# Patient Record
Sex: Female | Born: 1945 | Race: White | Hispanic: No | Marital: Married | Smoking: Never smoker
Health system: Southern US, Community
[De-identification: ages and names within clinical notes are randomized; demographics above are authoritative.]

## PROBLEM LIST (undated history)

## (undated) DIAGNOSIS — I10 Essential (primary) hypertension: Secondary | ICD-10-CM

## (undated) DIAGNOSIS — E1169 Type 2 diabetes mellitus with other specified complication: Secondary | ICD-10-CM

## (undated) DIAGNOSIS — F32A Depression, unspecified: Secondary | ICD-10-CM

## (undated) DIAGNOSIS — I493 Ventricular premature depolarization: Secondary | ICD-10-CM

## (undated) DIAGNOSIS — M810 Age-related osteoporosis without current pathological fracture: Secondary | ICD-10-CM

## (undated) DIAGNOSIS — E119 Type 2 diabetes mellitus without complications: Secondary | ICD-10-CM

## (undated) DIAGNOSIS — Z87442 Personal history of urinary calculi: Secondary | ICD-10-CM

## (undated) DIAGNOSIS — M1990 Unspecified osteoarthritis, unspecified site: Secondary | ICD-10-CM

## (undated) HISTORY — DX: Type 2 diabetes mellitus without complications: E11.9

## (undated) HISTORY — DX: Ventricular premature depolarization: I49.3

## (undated) HISTORY — PX: CATARACT EXTRACTION: SUR2

## (undated) HISTORY — DX: Essential (primary) hypertension: I10

## (undated) HISTORY — PX: COLONOSCOPY WITH ESOPHAGOGASTRODUODENOSCOPY (EGD): SHX5779

## (undated) HISTORY — DX: Type 2 diabetes mellitus with other specified complication: E11.69

---

## 2020-11-09 ENCOUNTER — Other Ambulatory Visit: Payer: Self-pay

## 2020-11-09 ENCOUNTER — Ambulatory Visit: Payer: Medicare PPO | Admitting: Cardiology

## 2020-11-09 ENCOUNTER — Encounter: Payer: Self-pay | Admitting: Cardiology

## 2020-11-09 VITALS — BP 124/60 | HR 70 | Ht 64.5 in | Wt 145.0 lb

## 2020-11-09 DIAGNOSIS — I10 Essential (primary) hypertension: Secondary | ICD-10-CM

## 2020-11-09 DIAGNOSIS — E1169 Type 2 diabetes mellitus with other specified complication: Secondary | ICD-10-CM

## 2020-11-09 DIAGNOSIS — Z716 Tobacco abuse counseling: Secondary | ICD-10-CM

## 2020-11-09 DIAGNOSIS — E119 Type 2 diabetes mellitus without complications: Secondary | ICD-10-CM | POA: Diagnosis not present

## 2020-11-09 DIAGNOSIS — E785 Hyperlipidemia, unspecified: Secondary | ICD-10-CM

## 2020-11-09 DIAGNOSIS — Z7189 Other specified counseling: Secondary | ICD-10-CM

## 2020-11-09 DIAGNOSIS — I493 Ventricular premature depolarization: Secondary | ICD-10-CM

## 2020-11-09 DIAGNOSIS — Z794 Long term (current) use of insulin: Secondary | ICD-10-CM

## 2020-11-09 NOTE — Patient Instructions (Signed)

## 2020-11-09 NOTE — Progress Notes (Signed)
Cardiology Office Note:    Date:  11/09/2020   ID:  Sarah Key, DOB 1945/09/10, MRN [id number]  PCP:  Patient, No Pcp Per  Cardiologist:  Buford Dresser, MD  Referring MD: Sidonie Dickens, MD   CC: new patient evaluation for PVCs  History of Present Illness:    Sarah Key is a 75 y.o. female with a hx of type II diabetes, hypertension, hyperlipidemia, PVCs who is seen as a new consult at the request of Kuritzky, Cherylann Banas, MD for the evaluation and management of PVCs.  I reviewed notes from [facility]. This included hard copies of echo from 06/23/2019 and monitor 05/2019. These are summarized below.  Today: Moved to [place], moved into [facility]. Was followed by Dr. Madlyn Frankel in [facility] prior to her move. Reviewed records today.   Has been followed for PVCs for about 2 years. Very symptomatic with them, felt her heart pounding.  Has struggled with blood pressure control, but now has been stable. Checks BP at home, 120s/60s-70s. No side effects from medications.  Stays active, walks and does exercises daily. Does stretching and light weightlifting as well. No limitations.  Avoids fried food, has a meal plan at [facility]. Would prefer more salads/vegetables but eats as well as she can.  Not sure what other statins she has tried, but doing well on pravastatin and ezetimibe. No known MI/CVA. Diabetes is well controlled  Mother had cardiac arrhythmia. Father died during cardiac surgery at age 55.   Denies chest pain, shortness of breath at rest or with normal exertion. No PND, orthopnea, LE edema or unexpected weight gain. No syncope or palpitations.  General ROS +hot flashes since menopause, rare hemorrhoid bleeding  Past Medical History:  Diagnosis Date   Hyperlipidemia associated with type 2 diabetes mellitus ([place])    Hypertension    PVC's (premature ventricular contractions)    Type II diabetes mellitus ([place])     History reviewed. No pertinent  surgical history.  Current Medications: Current Outpatient Medications on File Prior to Visit  Medication Sig   acetic acid-hydrocortisone (VOSOL-HC) OTIC solution Place 3 drops into both ears in the morning and at bedtime.   aspirin 81 MG chewable tablet Chew 1 tablet by mouth daily.   atenolol (TENORMIN) 100 MG tablet Take 1 tablet by mouth daily.   Blood Glucose Monitoring Suppl (GLUCOCOM BLOOD GLUCOSE MONITOR) DEVI Please provide glucometer- One Touch or whatever brand is covered by insurance.   buPROPion (WELLBUTRIN XL) 150 MG 24 hr tablet Take 2 tablets by mouth every morning.   buPROPion (WELLBUTRIN) 75 MG tablet Take 1 tablet by mouth daily.   butalbital-aspirin-caffeine (FIORINAL) 50-325-40 MG capsule Take 2 capsules by mouth as directed.   Calcium Carbonate-Vitamin D3 600-400 MG-UNIT TABS Take 1 tablet by mouth daily.   diltiazem (TIAZAC) 240 MG 24 hr capsule Take 1 capsule by mouth daily.   ezetimibe (ZETIA) 10 MG tablet Take 1 tablet by mouth daily.   insulin aspart (NOVOLOG) 100 UNIT/ML FlexPen Inject 15 Units into the skin in the morning, at noon, and at bedtime.   insulin aspart (NOVOLOG) 100 UNIT/ML injection Inject 30 Units into the skin as directed.   insulin glargine (LANTUS) 100 UNIT/ML injection Inject 30 Units into the skin as directed.   Insulin Syringe-Needle U-100 31G X 5/16" 1 ML MISC 3 (three) times daily.   Magnesium 400 MG CAPS Take 1 capsule by mouth daily.   metronidazole (NORITATE) 1 % cream Apply 1 application topically as directed.  Multiple Vitamin (MULTIVITAMIN ADULT PO) Take 1 tablet by mouth daily.   olmesartan (BENICAR) 40 MG tablet Take 1 tablet by mouth daily.   Omega-3 Fatty Acids (FISH OIL) 1000 MG CAPS Take 1 tablet by mouth in the morning and at bedtime.   pravastatin (PRAVACHOL) 40 MG tablet Take 1 tablet by mouth daily.   PYRIDOXINE HCL ER PO Take by mouth.   triamcinolone (KENALOG) 0.1 % Apply 1 application topically as  directed.   vitamin B-12 (CYANOCOBALAMIN) 250 MCG tablet Take 1 tablet by mouth daily.   No current facility-administered medications on file prior to visit.     Allergies:   Enalapril maleate, Quinapril, Rosuvastatin, Sulfa antibiotics, and Ampicillin   Social History   Tobacco Use   Smoking status: Never Smoker   Smokeless tobacco: Never Used    Family History: Mother had cardiac arrhythmia. Father died during cardiac surgery at age 76.   ROS:   Please see the history of present illness.  Additional pertinent ROS: Constitutional: Negative for chills, fever, night sweats, unintentional weight loss. Occasional hot flashes HENT: Negative for ear pain and hearing loss.   Eyes: Negative for loss of vision and eye pain.  Respiratory: Negative for cough, sputum, wheezing.   Cardiovascular: See HPI. Gastrointestinal: Negative for abdominal pain, melena, and hematochezia (only rarely with hemorrhoids).  Genitourinary: Negative for dysuria and hematuria.  Musculoskeletal: Negative for falls and myalgias.  Skin: Negative for itching and rash.  Neurological: Negative for focal weakness, focal sensory changes and loss of consciousness.  Endo/Heme/Allergies: Does not bruise/bleed easily.     EKGs/Labs/Other Studies Reviewed:    The following studies were reviewed today: Echo 06/23/2019 [facility]) LVEF >70%, moderate LVH, could not assess diastology RV normal LA mildly dilated, RA normal AoV probably trileaflet with mild thickencing, no significant AR/AS Mitral valve mildly thickened, no MR, +MAC TV normal, trivial TR PV poorly seen but no significant abnormalities IVC normal, collapses No effusion  14 day Zio monitor 05/2019 Min HR 48, max HR 187, avg 66 bpm. Sinus predominantly. 3 episodes of NSVT, longest 5 beats, max rate 187 bpm. 7 episodes of SVT, longest 7 beats. PVC burden 1.7%.  EKG:  EKG is personally reviewed.  The ekg ordered today demonstrates NSR at 70 bpm  Recent  Labs: No results found for requested labs within last 8760 hours.  Recent Lipid Panel No results found for: CHOL, TRIG, HDL, CHOLHDL, VLDL, LDLCALC, LDLDIRECT  Physical Exam:    VS:  BP 124/60    Pulse 70    Ht 5' 4.5" (1.638 m)    Wt 145 lb (65.8 kg)    BMI 24.50 kg/m     Wt Readings from Last 3 Encounters:  11/09/20 145 lb (65.8 kg)    GEN: Well nourished, well developed in no acute distress HEENT: Normal, moist mucous membranes NECK: No JVD CARDIAC: regular rhythm, normal S1 and S2, no rubs or gallops. No murmur. VASCULAR: Radial and DP pulses 2+ bilaterally. No carotid bruits RESPIRATORY:  Clear to auscultation without rales, wheezing or rhonchi  ABDOMEN: Soft, non-tender, non-distended MUSCULOSKELETAL:  Ambulates independently SKIN: Warm and dry, no edema NEUROLOGIC:  Alert and oriented x 3. No focal neuro deficits noted. PSYCHIATRIC:  Normal affect    ASSESSMENT:    1. PVC (premature ventricular contraction)   2. Type 2 diabetes mellitus without complication, with long-term current use of insulin ([place])   3. Primary hypertension   4. Hyperlipidemia associated with type 2 diabetes mellitus ([place])  5. PVC's (premature ventricular contractions)   6. Cardiac risk counseling   7. Tobacco abuse counseling    PLAN:    History of PVCs with NSVT: -asymptomatic currently, doing well on medication regimen -she is on atenolol 100 mg daily and diltiazem 240 mg daily. I would be concerned about bradycardia given these doses, but she is tolerating well -reviewed red flag warning signs that need immediate medical attention  Type II diabetes on insulin: -she is looking to establish with new PCP, she will see what is closest to her  -has enough medications for now -continue aspirin for primary prevention -continue statin as below -last A1c 7.6 per care everywhere  Hypertension: -continue atenolol and diltiazem as above -continue olmesartan 40 mg daily -BP at goal of <130/80  today  Hyperlipidemia: -continue pravastatin 40 mg daily, ezetimibe 10 mg daily -last lipids show Tchol 134, TG 141, HDL 40, LDL 69 per care everywhere  Cardiac risk counseling and prevention recommendations: -recommend heart healthy/Mediterranean diet, with whole grains, fruits, vegetable, fish, lean meats, nuts, and olive oil. Limit salt. -recommend moderate walking, 3-5 times/week for 30-50 minutes each session. Aim for at least 150 minutes.week. Goal should be pace of 3 miles/hours, or walking 1.5 miles in 30 minutes -recommend avoidance of tobacco products. Avoid excess alcohol. -ASCVD risk score: The 10-year ASCVD risk score Mikey Bussing DC Brooke Bonito., et al., 2013) is: 33.7%   Values used to calculate the score:     Age: 26 years     Sex: Female     Is Non-Hispanic African American: No     Diabetic: Yes     Tobacco smoker: No     Systolic Blood Pressure: 224 mmHg     Is BP treated: Yes     HDL Cholesterol: 40 mg/dL     Total Cholesterol: 134 mg/dL    Plan for follow up: 1 year or sooner as needed  Buford Dresser, MD, PhD, [facility]    Medication Adjustments/Labs and Tests Ordered: Current medicines are reviewed at length with the patient today.  Concerns regarding medicines are outlined above.  Orders Placed This Encounter  Procedures   EKG 12-Lead   No orders of the defined types were placed in this encounter.   Patient Instructions  Medication Instructions:  Your Physician recommend you continue on your current medication as directed.    *If you need a refill on your cardiac medications before your next appointment, please call your pharmacy*   Lab Work: None   Testing/Procedures: None   Follow-Up: At [facility], you and your health needs are our priority.  As part of our continuing mission to provide you with exceptional heart care, we have created designated Provider Care Teams.  These Care Teams include your primary Cardiologist  (physician) and Advanced Practice Providers (APPs -  Physician Assistants and Nurse Practitioners) who all work together to provide you with the care you need, when you need it.  We recommend signing up for the patient portal called "MyChart".  Sign up information is provided on this After Visit Summary.  MyChart is used to connect with patients for Virtual Visits (Telemedicine).  Patients are able to view lab/test results, encounter notes, upcoming appointments, etc.  Non-urgent messages can be sent to your provider as well.   To learn more about what you can do with MyChart, go to [email or web address].    Your next appointment:   1 year(s)  The format for your next appointment:  In Person  Provider:   Buford Dresser, MD       Signed, Buford Dresser, MD PhD 11/09/2020     [place]

## 2020-11-11 ENCOUNTER — Encounter: Payer: Self-pay | Admitting: Cardiology

## 2020-11-11 DIAGNOSIS — E119 Type 2 diabetes mellitus without complications: Secondary | ICD-10-CM | POA: Insufficient documentation

## 2020-11-11 DIAGNOSIS — E785 Hyperlipidemia, unspecified: Secondary | ICD-10-CM | POA: Insufficient documentation

## 2020-11-11 DIAGNOSIS — E1169 Type 2 diabetes mellitus with other specified complication: Secondary | ICD-10-CM | POA: Insufficient documentation

## 2020-11-11 DIAGNOSIS — I10 Essential (primary) hypertension: Secondary | ICD-10-CM | POA: Insufficient documentation

## 2020-11-11 DIAGNOSIS — I493 Ventricular premature depolarization: Secondary | ICD-10-CM | POA: Insufficient documentation

## 2020-12-01 ENCOUNTER — Ambulatory Visit: Payer: Self-pay | Admitting: Cardiology

## 2021-02-02 ENCOUNTER — Other Ambulatory Visit: Payer: Self-pay | Admitting: Cardiology

## 2021-02-25 HISTORY — PX: CATARACT EXTRACTION: SUR2

## 2021-03-03 ENCOUNTER — Other Ambulatory Visit: Payer: Self-pay | Admitting: Cardiology

## 2021-03-24 ENCOUNTER — Ambulatory Visit: Payer: Medicare PPO | Admitting: Internal Medicine

## 2021-03-24 ENCOUNTER — Encounter: Payer: Self-pay | Admitting: Internal Medicine

## 2021-03-24 ENCOUNTER — Other Ambulatory Visit: Payer: Self-pay

## 2021-03-24 VITALS — BP 122/62 | HR 53 | Temp 98.6°F | Resp 18 | Ht 64.5 in | Wt 140.2 lb

## 2021-03-24 DIAGNOSIS — I10 Essential (primary) hypertension: Secondary | ICD-10-CM | POA: Diagnosis not present

## 2021-03-24 DIAGNOSIS — E1169 Type 2 diabetes mellitus with other specified complication: Secondary | ICD-10-CM

## 2021-03-24 DIAGNOSIS — Z794 Long term (current) use of insulin: Secondary | ICD-10-CM

## 2021-03-24 DIAGNOSIS — E785 Hyperlipidemia, unspecified: Secondary | ICD-10-CM

## 2021-03-24 DIAGNOSIS — M810 Age-related osteoporosis without current pathological fracture: Secondary | ICD-10-CM

## 2021-03-24 DIAGNOSIS — K529 Noninfective gastroenteritis and colitis, unspecified: Secondary | ICD-10-CM | POA: Diagnosis not present

## 2021-03-24 NOTE — Progress Notes (Signed)
   Subjective:   Patient ID: Sarah Key, female    DOB: [birth year], 75 y.o.   MRN: [id number]  HPI The patient is a 75 YO female coming in new for several concerns including diabetes (type 2 diagnosed 2004, on insulin for some time with mealtime and lantus, having high readings in the middle of day recently with steroids for GI issues, morning 120s, previously has had middle of the night lows, needs endocrinologist locally as she has moved from [place]), and colitis (diagnosed in the last year, she is on budesonide and mesalamine, she has had resolution of symptoms and feels well currently from this, does not want to be on steroids long term, needs local GI provider). Also needs care for other chronic conditions.   Had reclast Jan 2022 which was first for osteoporosis  PMH, [facility], social history reviewed and updated  Review of Systems  Constitutional: Negative.   HENT: Negative.    Eyes: Negative.   Respiratory:  Negative for cough, chest tightness and shortness of breath.   Cardiovascular:  Negative for chest pain, palpitations and leg swelling.  Gastrointestinal:  Negative for abdominal distention, abdominal pain, constipation, diarrhea, nausea and vomiting.  Endocrine:       High sugars.  Musculoskeletal: Negative.   Skin: Negative.   Neurological: Negative.   Psychiatric/Behavioral: Negative.     Objective:  Physical Exam Constitutional:      Appearance: She is well-developed.  HENT:     Head: Normocephalic and atraumatic.  Cardiovascular:     Rate and Rhythm: Normal rate and regular rhythm.  Pulmonary:     Effort: Pulmonary effort is normal. No respiratory distress.     Breath sounds: Normal breath sounds. No wheezing or rales.  Abdominal:     General: Bowel sounds are normal. There is no distension.     Palpations: Abdomen is soft.     Tenderness: There is no abdominal tenderness. There is no rebound.  Musculoskeletal:     Cervical back: Normal range of motion.   Skin:    General: Skin is warm and dry.  Neurological:     Mental Status: She is alert and oriented to person, place, and time.     Coordination: Coordination normal.    Vitals:   03/24/21 0954  BP: 122/62  Pulse: (!) 53  Resp: 18  Temp: 98.6 F (37 C)  TempSrc: Oral  SpO2: 98%  Weight: 140 lb 3.2 oz (63.6 kg)  Height: 5' 4.5" (1.638 m)    This visit occurred during the SARS-CoV-2 public health emergency.  Safety protocols were in place, including screening questions prior to the visit, additional usage of staff PPE, and extensive cleaning of exam room while observing appropriate contact time as indicated for disinfecting solutions.   Assessment & Plan:

## 2021-03-24 NOTE — Patient Instructions (Addendum)
We will have you get in with the endocrinologist and the GI doctor.   Let us know in 1-2 weeks how the sugars are doing and we can make changes to the insulin if needed.

## 2021-03-25 ENCOUNTER — Encounter: Payer: Self-pay | Admitting: Internal Medicine

## 2021-03-25 DIAGNOSIS — K529 Noninfective gastroenteritis and colitis, unspecified: Secondary | ICD-10-CM | POA: Insufficient documentation

## 2021-03-25 DIAGNOSIS — M810 Age-related osteoporosis without current pathological fracture: Secondary | ICD-10-CM | POA: Insufficient documentation

## 2021-03-25 NOTE — Assessment & Plan Note (Signed)
Needs GI referral for medication management of her colitis and hopefully to reduce her budesonide. Records are in care everywhere.

## 2021-03-25 NOTE — Assessment & Plan Note (Signed)
Refer to endocrinology and she is taking lantus 26 units daily. Since her morning readings are at goal 120 she does not need increase but we talked about splitting to morning and evening dosing to help with afternoon high readings. For now will leave lantus 26 units qhs and adjust novolog sliding scale. She is adding 2pm dosing novolog to help as that is the time she is struggling with sugar readings. We elected not to check HgA1c today due to current steroid dosing.

## 2021-03-25 NOTE — Assessment & Plan Note (Signed)
Has had reclast infusion Jan 2022 so will be due Jan 2023 and then can recheck DEXA late 2023.

## 2021-03-25 NOTE — Assessment & Plan Note (Signed)
Taking atenolol and diltiazem and olmesartan and BP at goal. Will not adjust today. Reviewed recent labs.

## 2021-03-25 NOTE — Assessment & Plan Note (Signed)
Taking zetia and pravastatin 40 mg daily. No lipid panel indicated today.

## 2021-03-28 HISTORY — PX: CATARACT EXTRACTION: SUR2

## 2021-06-03 ENCOUNTER — Ambulatory Visit: Payer: Medicare PPO | Admitting: Endocrinology

## 2021-06-09 ENCOUNTER — Ambulatory Visit: Payer: Medicare PPO | Admitting: Internal Medicine

## 2021-06-09 ENCOUNTER — Encounter: Payer: Self-pay | Admitting: Internal Medicine

## 2021-06-09 ENCOUNTER — Other Ambulatory Visit: Payer: Self-pay

## 2021-06-09 VITALS — BP 126/64 | HR 58 | Temp 98.7°F | Resp 18 | Ht 64.5 in | Wt 145.0 lb

## 2021-06-09 DIAGNOSIS — Z794 Long term (current) use of insulin: Secondary | ICD-10-CM | POA: Diagnosis not present

## 2021-06-09 DIAGNOSIS — M810 Age-related osteoporosis without current pathological fracture: Secondary | ICD-10-CM

## 2021-06-09 DIAGNOSIS — E1169 Type 2 diabetes mellitus with other specified complication: Secondary | ICD-10-CM

## 2021-06-09 DIAGNOSIS — Z0001 Encounter for general adult medical examination with abnormal findings: Secondary | ICD-10-CM | POA: Diagnosis not present

## 2021-06-09 DIAGNOSIS — E785 Hyperlipidemia, unspecified: Secondary | ICD-10-CM

## 2021-06-09 DIAGNOSIS — K529 Noninfective gastroenteritis and colitis, unspecified: Secondary | ICD-10-CM

## 2021-06-09 NOTE — Assessment & Plan Note (Signed)
Having some recurrence of symptoms and seeing GI tomorrow. We talked about how sometimes with tapering of steroids there may be some change in symptoms. She is having some increased stools and rare blood in stool without pain.

## 2021-06-09 NOTE — Assessment & Plan Note (Signed)
She will get reclast through endocrine Jan 2023 then would like to get further locally since she does not live in [place] any longer.

## 2021-06-09 NOTE — Assessment & Plan Note (Signed)
Flu shot yearly. Covid-19 booster recommended. Pneumonia complete. Shingrix counseled to get at pharmacy if not done. Tetanus she will check date due. Colonoscopy up to date with GI may need additional for colitis but not for colon cancer screening. Mammogram due 2023, pap smear aged out further and dexa due 2023 later in the year. Counseled about sun safety and mole surveillance. Counseled about the dangers of distracted driving. Given 10 year screening recommendations.

## 2021-06-09 NOTE — Assessment & Plan Note (Signed)
Taking pravastatin 40 mg daily and doing well. Due for lipid panel at follow up in 6 months.

## 2021-06-09 NOTE — Assessment & Plan Note (Signed)
Reviewed recent HgA1c 8.9 with her from endocrine and she is still on steroid for colitis and they are working on tapering. She is monitoring sugars and they have improved in the last several weeks to month so suspect the HgA1c may be lagging. She will continue lantus and humalog per current scale. She is on ARB and statin.

## 2021-06-09 NOTE — Progress Notes (Signed)
Subjective:   Patient ID: Sarah Key, female    DOB: [birth year], 75 y.o.   MRN: [id number]  HPI Here for medicare wellness, and follow up no new complaints. Please see A/P for status and treatment of chronic medical problems.   Diet: DM since diabetic Physical activity: sedentary Depression/mood screen: negative Hearing: intact to whispered voice Visual acuity: grossly normal with lens, performs annual eye exam  ADLs: capable Fall risk: none Home safety: good Cognitive evaluation: intact to orientation, naming, recall and repetition EOL planning: adv directives discussed  Flowsheet Row Office Visit from 03/24/2021 in [facility] Total Score 0        No flowsheet data found.  I have personally reviewed and have noted 1. The patient's medical and social history - reviewed today no changes 2. Their use of alcohol, tobacco or illicit drugs 3. Their current medications and supplements 4. The patient's functional ability including ADL's, fall risks, home safety risks and hearing or visual impairment. 5. Diet and physical activities 6. Evidence for depression or mood disorders 7. Care team reviewed and updated 8.  The patient is not on an opioid pain medication. Patient Care Team: Myrlene Broker, MD as PCP - General (Internal Medicine) Jodelle Red, MD as PCP - Cardiology (Cardiology) Past Medical History:  Diagnosis Date   Hyperlipidemia associated with type 2 diabetes mellitus ([facility])    Hypertension    PVC's (premature ventricular contractions)    Type II diabetes mellitus ([facility])    Past Surgical History:  Procedure Laterality Date   CATARACT EXTRACTION Right    History reviewed. No pertinent family history.  Review of Systems  Constitutional: Negative.   HENT: Negative.    Eyes: Negative.   Respiratory:  Negative for cough, chest tightness and shortness of breath.   Cardiovascular:  Negative for chest pain,  palpitations and leg swelling.  Gastrointestinal:  Positive for blood in stool and diarrhea. Negative for abdominal distention, abdominal pain, constipation, nausea and vomiting.  Musculoskeletal: Negative.   Skin: Negative.   Neurological: Negative.   Psychiatric/Behavioral: Negative.     Objective:  Physical Exam Constitutional:      Appearance: She is well-developed.  HENT:     Head: Normocephalic and atraumatic.  Cardiovascular:     Rate and Rhythm: Normal rate and regular rhythm.  Pulmonary:     Effort: Pulmonary effort is normal. No respiratory distress.     Breath sounds: Normal breath sounds. No wheezing or rales.  Abdominal:     General: Bowel sounds are normal. There is no distension.     Palpations: Abdomen is soft.     Tenderness: There is no abdominal tenderness. There is no rebound.  Musculoskeletal:     Cervical back: Normal range of motion.  Skin:    General: Skin is warm and dry.  Neurological:     Mental Status: She is alert and oriented to person, place, and time.     Coordination: Coordination normal.    Vitals:   06/09/21 1019  BP: 126/64  Pulse: (!) 58  Resp: 18  Temp: 98.7 F (37.1 C)  TempSrc: Oral  SpO2: 98%  Weight: 145 lb (65.8 kg)  Height: 5' 4.5" (1.638 m)   This visit occurred during the SARS-CoV-2 public health emergency.  Safety protocols were in place, including screening questions prior to the visit, additional usage of staff PPE, and extensive cleaning of exam room while observing appropriate contact time as indicated for disinfecting  solutions.   Assessment & Plan:

## 2021-06-20 ENCOUNTER — Other Ambulatory Visit: Payer: Self-pay

## 2021-06-20 ENCOUNTER — Encounter (HOSPITAL_COMMUNITY): Payer: Self-pay | Admitting: Orthopedic Surgery

## 2021-06-20 NOTE — Anesthesia Preprocedure Evaluation (Addendum)
Anesthesia Evaluation  Patient identified by MRN, date of birth, ID band Patient awake    Reviewed: Allergy & Precautions, NPO status , Patient's Chart, lab work & pertinent test results  Airway Mallampati: II  TM Distance: >3 FB Neck ROM: Full    Dental no notable dental hx. (+) Teeth Intact, Dental Advisory Given   Pulmonary neg pulmonary ROS,    Pulmonary exam normal breath sounds clear to auscultation       Cardiovascular hypertension, Pt. on home beta blockers and Pt. on medications Normal cardiovascular exam Rhythm:Regular Rate:Normal  Echo 06/23/19 [facility]): Summary  1. The left ventricle is normal in size with moderately increased wall  thickness.  2. Hyperdynamic left ventricular systolic function, ejection fraction >70%.  3. Dilated left atrium - mildly dilated.  4. Degenerative mitral valve disease - mildly thickened.  5. Mitral annular calcification.  6. Aortic sclerosis.  7. Normal right ventricular size and systolic function.    Zio XT cardiac event monitor 06/16/19-06/30/19 [facility]): Conclusions:  - Ambulatory ECG monitoring was performed from 06/16/19 to 06/30/19.  - The predominant rhythm was sinus rhythm, with the rate ranging from 48 to 102 and averaging 65 bpm when in sinus rhythm  - Rare supraventricular ectopics (PACs) were recorded with 7 episodes of SVT, longest lasting 7 beats at 102 bpm  - Occasional ventricular ectopics (PVCs) were recorded (1.7% burden) with 3 episodes of wide-complex tachycardia, longest lasting 5 beats at 154 bpm  - Patient-initiated recordings/events revealed sinus rhythm and PVCs including ventricular trigeminy  - No pauses >3 seconds or high-grade AV block detected  - No atrial fibrillation detected    Neuro/Psych PSYCHIATRIC DISORDERS Depression negative neurological ROS     GI/Hepatic Neg liver ROS, GERD  Medicated and Controlled,  Endo/Other   diabetes, Type 2, Insulin Dependent  Renal/GU negative Renal ROS  negative genitourinary   Musculoskeletal  (+) Arthritis ,   Abdominal   Peds  Hematology negative hematology ROS (+)   Anesthesia Other Findings   Reproductive/Obstetrics                           Anesthesia Physical Anesthesia Plan  ASA: 3  Anesthesia Plan: MAC and Regional   Post-op Pain Management:  Regional for Post-op pain   Induction: Intravenous  PONV Risk Score and Plan: 2 and Propofol infusion, Treatment may vary due to age or medical condition and Ondansetron  Airway Management Planned: Natural Airway  Additional Equipment:   Intra-op Plan:   Post-operative Plan:   Informed Consent: I have reviewed the patients History and Physical, chart, labs and discussed the procedure including the risks, benefits and alternatives for the proposed anesthesia with the patient or authorized representative who has indicated his/her understanding and acceptance.     Dental advisory given  Plan Discussed with: CRNA  Anesthesia Plan Comments:        Anesthesia Quick Evaluation

## 2021-06-20 NOTE — Progress Notes (Addendum)
I spoke to Sarah Key, Sarah Key denies chest pain or shortness of breath. Sarah Key denies having any s/s of Covid in her household.  Sarah Key denies any known exposure to Covid.   Sarah Stogner has type II diabetes, Sarah Key  A1C was 8.9 in August of this year. I instructed Sarah Key to take 14 units of Lantus tonight, in am if CBG is more  than 220, take 1/2 of sliding scale. I instructed Sarah Key to check CBG after awaking and every 2 hours until arrival  to the hospital.  I Instructed Sarah Key if CBG is less than 70 to take 4 Glucose Tablets or 1 tube of Glucose Gel or 1/2 cup of a clear juice. Recheck CBG in 15 minutes if CBG is not over 70 call, pre- op desk at [phone] for further instructions.   I instructed Sarah Key to shower with antibiotic soap, if it is available.  Dry off with a clean towel. Do not put lotion, powder, cologne or deodorant or makeup.No jewelry or piercings. Men may shave their face and neck. Woman should not shave. No nail polish, artificial or acrylic nails. Wear clean clothes, brush your teeth. Glasses, contact lens,dentures or partials may not be worn in the OR. If you need to wear them, please bring a case for glasses, do not wear contacts or bring a case, the hospital does not have contact cases, dentures or partials will have to be removed , make sure they are clean, we will provide a denture cup to put them in. You will need some one to drive you home and a responsible person over the age of 58 to stay with you for the first 24 hours after surgery.

## 2021-06-20 NOTE — Progress Notes (Signed)
Anesthesia Chart Review: SAME DAY WORK-UP  Case: [id number] Date/Time: 06/21/21 1545   Procedure: OPEN REDUCTION INTERNAL FIXATION (ORIF) ELBOW/OLECRANON FRACTURE WITH ULNAR NERVE RELEASE AND REPAIR AS NECESSARY (Right: Elbow) - BLOCK WITH IV SEDATION 90 MINS   Anesthesia type: Regional   Pre-op diagnosis: Right elbow olecranon fracture displaced   Location: [facility] OR ROOM 06 / [place] OR   Surgeons: Roseanne Kaufman, MD       DISCUSSION: Patient is a 75 year old female scheduled for the above procedure.  Reportedly evaluated by Dr. Amedeo Plenty for displaced right elbow fracture on 06/20/2021 and the above procedure recommended.   History includes never smoker, DM2, HTN, HLD, PVCs.  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 Note contains information as currently available in [facility] and Care Everywhere. She did have labs on 06/10/21 that can be viewed in [facility]. She had recent PCP follow-up this month and saw cardiologist Dr. Harrell Gave in March. Anesthesia team to evaluate on the day of surgery.    VS:  BP Readings from Last 3 Encounters:  06/09/21 126/64  03/24/21 122/62  11/09/20 124/60   Pulse Readings from Last 3 Encounters:  06/09/21 (!) 58  03/24/21 (!) 53  11/09/20 70     PROVIDERS: Hoyt Koch, MD is PCP. Last visit 06/09/21. She relocated to [facility] from [place] earlier this year. It appears she has pending referrals to local endocrinologist and GI. - Buford Dresser, MD is cardiologist. Last visit 11/09/20 with one year follow-up planned.  Previously followed by Stevan Born, MD with [facility]. 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 - Last endocrinologist visit seen is with Ashley Mariner, MD on 05/03/21 [facility]). Her A1c had worsened, likely in the  setting of initiation of Entocort for colitis. She was tapering off steroids at that time and had readjusted her carbohydrate ratios, so no change in DM regimen made with 3 month follow-up planned. - Last GI visit seen is with Kerri Perches, MD on 06/10/21 [facility]). S/p EGD and colonoscopy on 01/04/21 [facility]).   LABS: She had labs on 06/10/21 which include a CMET and CBC. Results can be viewed in [facility].  Sodium was 134, potassium 3.5, BUN 16, creatinine 0.85, glucose 93, AST 51 ALT 46, total bilirubin 1.3, WBC 8.7, hemoglobin 13.4, hematocrit 39.8, platelet count 274K.  CRP < 4.0, ESR 7, IgA 35.6 (L), Alpha gal IgE < 0.10, TSH 0.929. A1c 8.9% 04/19/21.    EKG: 11/09/20: NSR   CV: Echo 06/23/19 [facility] CE): Summary    1. The left ventricle is normal in size with moderately increased wall  thickness.    2. Hyperdynamic left ventricular systolic function, ejection fraction >70%.    3. Dilated left atrium - mildly dilated.    4. Degenerative mitral valve disease - mildly thickened.    5. Mitral annular calcification.    6. Aortic sclerosis.    7. Normal right ventricular size and systolic function.     Zio XT cardiac event monitor 06/16/19-06/30/19 [facility] CE): Conclusions:  -  Ambulatory ECG monitoring was performed from 06/16/19 to 06/30/19.  -  The predominant rhythm was sinus rhythm, with the rate ranging from 48 to 102 and averaging 65 bpm when in sinus rhythm  -  Rare supraventricular ectopics (PACs)  were recorded with 7 episodes of SVT, longest lasting 7 beats at 102 bpm  -  Occasional ventricular ectopics (PVCs) were recorded (1.7% burden) with 3 episodes of wide-complex tachycardia, longest lasting 5 beats at 154 bpm  -  Patient-initiated recordings/events revealed sinus rhythm and PVCs including ventricular trigeminy  -  No pauses >3 seconds or high-grade AV block detected  -  No atrial fibrillation detected     Past Medical History:  Diagnosis Date   Hyperlipidemia  associated with type 2 diabetes mellitus ([place])    Hypertension    PVC's (premature ventricular contractions)    Type II diabetes mellitus ([place])     Past Surgical History:  Procedure Laterality Date   CATARACT EXTRACTION Right     MEDICATIONS: No current facility-administered medications for this encounter.    acetic acid-hydrocortisone (VOSOL-HC) OTIC solution   aspirin EC 325 MG tablet   aspirin EC 81 MG tablet   atenolol (TENORMIN) 100 MG tablet   budesonide (ENTOCORT EC) 3 MG 24 hr capsule   buPROPion (WELLBUTRIN XL) 150 MG 24 hr tablet   butalbital-aspirin-caffeine (FIORINAL) 50-325-40 MG capsule   chlorpheniramine (CHLOR-TRIMETON) 4 MG tablet   chlorthalidone (HYGROTON) 25 MG tablet   Cyanocobalamin (B-12) 2500 MCG TABS   doxycycline (PERIOSTAT) 20 MG tablet   ezetimibe (ZETIA) 10 MG tablet   famotidine (PEPCID) 40 MG tablet   HYDROcodone-acetaminophen (NORCO/VICODIN) 5-325 MG tablet   insulin aspart (NOVOLOG) 100 UNIT/ML FlexPen   insulin glargine (LANTUS) 100 UNIT/ML injection   Magnesium 400 MG CAPS   Melatonin 5 MG CAPS   mesalamine (PENTASA) 500 MG CR capsule   METAMUCIL FIBER PO   metronidazole (NORITATE) 1 % cream   Multiple Vitamin (MULTIVITAMIN ADULT PO)   olmesartan (BENICAR) 40 MG tablet   omega-3 acid ethyl esters (LOVAZA) 1 g capsule   ondansetron (ZOFRAN) 4 MG tablet   Polyethyl Glycol-Propyl Glycol (SYSTANE OP)   pravastatin (PRAVACHOL) 40 MG tablet   Pyridoxine HCl (B-6) 100 MG TABS   sucralfate (CARAFATE) 1 g tablet   TIADYLT ER 240 MG 24 hr capsule   triamcinolone (KENALOG) 0.1 %   Blood Glucose Monitoring Suppl (GLUCOCOM BLOOD GLUCOSE MONITOR) DEVI   Insulin Syringe-Needle U-100 31G X 5/16" 1 ML MISC   UNABLE TO FIND    Myra Gianotti, PA-C Surgical Short Stay/Anesthesiology [facility] Phone [phone] [facility] Phone [phone] 06/20/2021 4:40 PM

## 2021-06-21 ENCOUNTER — Ambulatory Visit (HOSPITAL_COMMUNITY): Payer: Medicare PPO

## 2021-06-21 ENCOUNTER — Encounter (HOSPITAL_COMMUNITY)
Admission: RE | Disposition: A | Discharge status: Home / Self Care | Payer: Self-pay | Source: Home / Self Care | Attending: Orthopedic Surgery

## 2021-06-21 ENCOUNTER — Encounter (HOSPITAL_COMMUNITY): Payer: Self-pay | Admitting: Orthopedic Surgery

## 2021-06-21 ENCOUNTER — Ambulatory Visit (HOSPITAL_COMMUNITY): Payer: Medicare PPO | Admitting: Vascular Surgery

## 2021-06-21 ENCOUNTER — Ambulatory Visit (HOSPITAL_COMMUNITY)
Admission: RE | Admit: 2021-06-21 | Discharge: 2021-06-21 | Disposition: A | Discharge status: Home / Self Care | Payer: Medicare PPO | Attending: Orthopedic Surgery | Admitting: Orthopedic Surgery

## 2021-06-21 ENCOUNTER — Other Ambulatory Visit: Payer: Self-pay

## 2021-06-21 DIAGNOSIS — I700 Atherosclerosis of aorta: Secondary | ICD-10-CM | POA: Insufficient documentation

## 2021-06-21 DIAGNOSIS — Z881 Allergy status to other antibiotic agents status: Secondary | ICD-10-CM | POA: Diagnosis not present

## 2021-06-21 DIAGNOSIS — X58XXXA Exposure to other specified factors, initial encounter: Secondary | ICD-10-CM | POA: Diagnosis not present

## 2021-06-21 DIAGNOSIS — Z79899 Other long term (current) drug therapy: Secondary | ICD-10-CM | POA: Insufficient documentation

## 2021-06-21 DIAGNOSIS — Z7982 Long term (current) use of aspirin: Secondary | ICD-10-CM | POA: Diagnosis not present

## 2021-06-21 DIAGNOSIS — Z882 Allergy status to sulfonamides status: Secondary | ICD-10-CM | POA: Insufficient documentation

## 2021-06-21 DIAGNOSIS — Z91048 Other nonmedicinal substance allergy status: Secondary | ICD-10-CM | POA: Insufficient documentation

## 2021-06-21 DIAGNOSIS — Z888 Allergy status to other drugs, medicaments and biological substances status: Secondary | ICD-10-CM | POA: Diagnosis not present

## 2021-06-21 DIAGNOSIS — E119 Type 2 diabetes mellitus without complications: Secondary | ICD-10-CM | POA: Diagnosis not present

## 2021-06-21 DIAGNOSIS — Y939 Activity, unspecified: Secondary | ICD-10-CM | POA: Diagnosis not present

## 2021-06-21 DIAGNOSIS — S52031A Displaced fracture of olecranon process with intraarticular extension of right ulna, initial encounter for closed fracture: Secondary | ICD-10-CM | POA: Insufficient documentation

## 2021-06-21 DIAGNOSIS — Z794 Long term (current) use of insulin: Secondary | ICD-10-CM | POA: Insufficient documentation

## 2021-06-21 DIAGNOSIS — Z7952 Long term (current) use of systemic steroids: Secondary | ICD-10-CM | POA: Insufficient documentation

## 2021-06-21 HISTORY — DX: Depression, unspecified: F32.A

## 2021-06-21 HISTORY — PX: ORIF ELBOW FRACTURE: SHX5031

## 2021-06-21 HISTORY — DX: Age-related osteoporosis without current pathological fracture: M81.0

## 2021-06-21 HISTORY — DX: Unspecified osteoarthritis, unspecified site: M19.90

## 2021-06-21 HISTORY — PX: ULNAR NERVE TRANSPOSITION: SHX2595

## 2021-06-21 HISTORY — DX: Personal history of urinary calculi: Z87.442

## 2021-06-21 LAB — SURGICAL PCR SCREEN
MRSA, PCR: NEGATIVE
Staphylococcus aureus: NEGATIVE

## 2021-06-21 LAB — GLUCOSE, CAPILLARY
Glucose-Capillary: 174 mg/dL — ABNORMAL HIGH (ref 70–99)
Glucose-Capillary: 201 mg/dL — ABNORMAL HIGH (ref 70–99)

## 2021-06-21 SURGERY — OPEN REDUCTION INTERNAL FIXATION (ORIF) ELBOW/OLECRANON FRACTURE
Anesthesia: Monitor Anesthesia Care | Site: Elbow | Laterality: Right

## 2021-06-21 MED ORDER — MIDAZOLAM HCL 2 MG/2ML IJ SOLN
INTRAMUSCULAR | Status: AC
  Administered 2021-06-21: 1 mg via INTRAVENOUS
  Filled 2021-06-21: qty 2

## 2021-06-21 MED ORDER — CHLORHEXIDINE GLUCONATE 0.12 % MT SOLN: 15.0000 mL | Freq: Once | OROMUCOSAL | Status: AC

## 2021-06-21 MED ORDER — FENTANYL CITRATE (PF) 100 MCG/2ML IJ SOLN
INTRAMUSCULAR | Status: AC
  Administered 2021-06-21: 50 ug via INTRAVENOUS
  Filled 2021-06-21: qty 2

## 2021-06-21 MED ORDER — DEXAMETHASONE SODIUM PHOSPHATE 10 MG/ML IJ SOLN
INTRAMUSCULAR | Status: DC | PRN
  Administered 2021-06-21: 5 mg via INTRAVENOUS

## 2021-06-21 MED ORDER — VANCOMYCIN HCL IN DEXTROSE 1-5 GM/200ML-% IV SOLN
1000.0000 mg | INTRAVENOUS | Status: AC
  Administered 2021-06-21: 1000 mg via INTRAVENOUS
  Filled 2021-06-21: qty 200

## 2021-06-21 MED ORDER — DEXMEDETOMIDINE (PRECEDEX) IN NS 20 MCG/5ML (4 MCG/ML) IV SYRINGE
PREFILLED_SYRINGE | INTRAVENOUS | Status: DC | PRN
  Administered 2021-06-21: 12 ug via INTRAVENOUS

## 2021-06-21 MED ORDER — CHLORHEXIDINE GLUCONATE 0.12 % MT SOLN
OROMUCOSAL | Status: AC
  Administered 2021-06-21: 15 mL via OROMUCOSAL
  Filled 2021-06-21: qty 15

## 2021-06-21 MED ORDER — PROPOFOL 10 MG/ML IV BOLUS
INTRAVENOUS | Status: DC | PRN
  Administered 2021-06-21: 30 mg via INTRAVENOUS
  Administered 2021-06-21: 50 mg via INTRAVENOUS
  Administered 2021-06-21: 30 mg via INTRAVENOUS

## 2021-06-21 MED ORDER — LACTATED RINGERS IV SOLN: INTRAVENOUS | Status: DC

## 2021-06-21 MED ORDER — EPHEDRINE SULFATE-NACL 50-0.9 MG/10ML-% IV SOSY
PREFILLED_SYRINGE | INTRAVENOUS | Status: DC | PRN
  Administered 2021-06-21: 5 mg via INTRAVENOUS
  Administered 2021-06-21: 10 mg via INTRAVENOUS
  Administered 2021-06-21: 5 mg via INTRAVENOUS
  Administered 2021-06-21: 10 mg via INTRAVENOUS

## 2021-06-21 MED ORDER — ROPIVACAINE HCL 7.5 MG/ML IJ SOLN
INTRAMUSCULAR | Status: DC | PRN
  Administered 2021-06-21: 20 mL via PERINEURAL

## 2021-06-21 MED ORDER — PROPOFOL 500 MG/50ML IV EMUL
INTRAVENOUS | Status: DC | PRN
  Administered 2021-06-21: 75 ug/kg/min via INTRAVENOUS

## 2021-06-21 MED ORDER — FENTANYL CITRATE (PF) 100 MCG/2ML IJ SOLN: 50.0000 ug | Freq: Once | INTRAMUSCULAR | Status: AC

## 2021-06-21 MED ORDER — PHENYLEPHRINE 40 MCG/ML (10ML) SYRINGE FOR IV PUSH (FOR BLOOD PRESSURE SUPPORT)
PREFILLED_SYRINGE | INTRAVENOUS | Status: DC | PRN
  Administered 2021-06-21 (×2): 40 ug via INTRAVENOUS
  Administered 2021-06-21: 80 ug via INTRAVENOUS

## 2021-06-21 MED ORDER — 0.9 % SODIUM CHLORIDE (POUR BTL) OPTIME
TOPICAL | Status: DC | PRN
  Administered 2021-06-21: 1000 mL

## 2021-06-21 MED ORDER — BUPIVACAINE HCL (PF) 0.25 % IJ SOLN
INTRAMUSCULAR | Status: AC
  Filled 2021-06-21: qty 30

## 2021-06-21 MED ORDER — MIDAZOLAM HCL 2 MG/2ML IJ SOLN: 1.0000 mg | Freq: Once | INTRAMUSCULAR | Status: AC

## 2021-06-21 MED ORDER — DEXAMETHASONE SODIUM PHOSPHATE 10 MG/ML IJ SOLN
INTRAMUSCULAR | Status: DC | PRN
  Administered 2021-06-21: 5 mg

## 2021-06-21 MED ORDER — ORAL CARE MOUTH RINSE: 15.0000 mL | Freq: Once | OROMUCOSAL | Status: AC

## 2021-06-21 MED ORDER — ONDANSETRON HCL 4 MG/2ML IJ SOLN
INTRAMUSCULAR | Status: DC | PRN
  Administered 2021-06-21: 4 mg via INTRAVENOUS

## 2021-06-21 SURGICAL SUPPLY — 64 items
BAG COUNTER SPONGE SURGICOUNT (BAG) ×2 IMPLANT
BIT DRILL 2.0 LNG QUCK RELEASE (BIT) ×1 IMPLANT
BIT DRILL 2.8 QUICK RELEASE (BIT) ×1 IMPLANT
BNDG COHESIVE 4X5 TAN STRL (GAUZE/BANDAGES/DRESSINGS) ×2 IMPLANT
BNDG ELASTIC 3X5.8 VLCR STR LF (GAUZE/BANDAGES/DRESSINGS) ×2 IMPLANT
BNDG ELASTIC 4X5.8 VLCR STR LF (GAUZE/BANDAGES/DRESSINGS) ×2 IMPLANT
BNDG ESMARK 4X9 LF (GAUZE/BANDAGES/DRESSINGS) ×2 IMPLANT
BNDG GAUZE ELAST 4 BULKY (GAUZE/BANDAGES/DRESSINGS) ×2 IMPLANT
CORD BIPOLAR FORCEPS 12FT (ELECTRODE) ×2 IMPLANT
COVER MAYO STAND STRL (DRAPES) ×2 IMPLANT
COVER SURGICAL LIGHT HANDLE (MISCELLANEOUS) ×2 IMPLANT
CUFF TOURN SGL QUICK 18X4 (TOURNIQUET CUFF) ×2 IMPLANT
CUFF TOURN SGL QUICK 24 (TOURNIQUET CUFF) ×2
CUFF TRNQT CYL 24X4X16.5-23 (TOURNIQUET CUFF) ×1 IMPLANT
DRAPE INCISE IOBAN 66X45 STRL (DRAPES) ×2 IMPLANT
DRAPE OEC MINIVIEW 54X84 (DRAPES) IMPLANT
DRILL 2.0 LNG QUICK RELEASE (BIT) ×2
DRILL 2.8 QUICK RELEASE (BIT) ×2
DRSG ADAPTIC 3X8 NADH LF (GAUZE/BANDAGES/DRESSINGS) ×2 IMPLANT
GAUZE SPONGE 4X4 12PLY STRL (GAUZE/BANDAGES/DRESSINGS) ×2 IMPLANT
GAUZE XEROFORM 1X8 LF (GAUZE/BANDAGES/DRESSINGS) ×2 IMPLANT
GLOVE SURG ENC TEXT LTX SZ8 (GLOVE) ×2 IMPLANT
GLOVE SURG MICRO LTX SZ8 (GLOVE) ×2 IMPLANT
GOWN STRL REUS W/ TWL LRG LVL3 (GOWN DISPOSABLE) ×2 IMPLANT
GOWN STRL REUS W/ TWL XL LVL3 (GOWN DISPOSABLE) ×3 IMPLANT
GOWN STRL REUS W/TWL LRG LVL3 (GOWN DISPOSABLE) ×4
GOWN STRL REUS W/TWL XL LVL3 (GOWN DISPOSABLE) ×6
GUIDEWIRE ORTH 6X062XTROC NS (WIRE) ×2 IMPLANT
GUIDEWIRE ORTHO 2.0X9 ST (WIRE) ×2 IMPLANT
K-WIRE .062 (WIRE) ×4
KIT BASIN OR (CUSTOM PROCEDURE TRAY) ×2 IMPLANT
KIT TURNOVER KIT B (KITS) ×2 IMPLANT
LOOP VESSEL MAXI BLUE (MISCELLANEOUS) IMPLANT
MANIFOLD NEPTUNE II (INSTRUMENTS) ×2 IMPLANT
NDL SUT 6 .5 CRC .975X.05 MAYO (NEEDLE) ×1 IMPLANT
NEEDLE HYPO 25GX1X1/2 BEV (NEEDLE) IMPLANT
NEEDLE MAYO TAPER (NEEDLE) ×2
NS IRRIG 1000ML POUR BTL (IV SOLUTION) ×2 IMPLANT
PACK ORTHO EXTREMITY (CUSTOM PROCEDURE TRAY) ×2 IMPLANT
PAD ARMBOARD 7.5X6 YLW CONV (MISCELLANEOUS) ×4 IMPLANT
PAD CAST 4YDX4 CTTN HI CHSV (CAST SUPPLIES) ×1 IMPLANT
PADDING CAST COTTON 4X4 STRL (CAST SUPPLIES) ×2
PLATE OLECRANON 5 HOLE (Plate) ×2 IMPLANT
SCREW CORTICAL 3.5X20MM (Screw) ×2 IMPLANT
SCREW HEXALOBE LOCK 3.5X50MM (Screw) ×2 IMPLANT
SCREW HEXALOBE LOCKING 3.5X14M (Screw) ×2 IMPLANT
SCREW HEXALOBE NON-LOCK 3.5X14 (Screw) ×2 IMPLANT
SCREW NON LOCKING HEX 2.7X20MM (Screw) ×2 IMPLANT
SCREW NON LOCKING HEX 3.5X24 (Screw) ×2 IMPLANT
SCREW NONLOCK HEX 2.7X18MM (Screw) ×2 IMPLANT
SLING ARM FOAM STRAP XLG (SOFTGOODS) ×2 IMPLANT
SOL PREP POV-IOD 4OZ 10% (MISCELLANEOUS) ×6 IMPLANT
SPECIMEN JAR SMALL (MISCELLANEOUS) ×2 IMPLANT
SUT FIBERWIRE #2 38 T-5 BLUE (SUTURE) ×4
SUT PROLENE 3 0 PS 1 (SUTURE) ×2 IMPLANT
SUT PROLENE 3 0 PS 2 (SUTURE) ×6 IMPLANT
SUT VIC AB 3-0 FS2 27 (SUTURE) ×2 IMPLANT
SUTURE FIBERWR #2 38 T-5 BLUE (SUTURE) ×2 IMPLANT
SYR CONTROL 10ML LL (SYRINGE) IMPLANT
TOWEL GREEN STERILE (TOWEL DISPOSABLE) ×2 IMPLANT
TOWEL GREEN STERILE FF (TOWEL DISPOSABLE) ×2 IMPLANT
TUBE CONNECTING 12X1/4 (SUCTIONS) IMPLANT
UNDERPAD 30X36 HEAVY ABSORB (UNDERPADS AND DIAPERS) ×2 IMPLANT
WATER STERILE IRR 1000ML POUR (IV SOLUTION) ×2 IMPLANT

## 2021-06-21 NOTE — H&P (Signed)
Sarah Key is an 75 y.o. female.   Chief Complaint: Fracture right elbow with displacement pain and soft tissue injury HPI: Patient presents for open reduction internal fixation right elbow fracture.  I discussed her all issues plans and concerns.  She desires to proceed with the above-mentioned surgical reconstruction.  Patient presents for evaluation and treatment of the of their upper extremity predicament. The patient denies neck, back, chest or  abdominal pain. The patient notes that they have no lower extremity problems. The patients primary complaint is noted. We are planning surgical care pathway for the upper extremity.   Past Medical History:  Diagnosis Date   Arthritis    Depression    History of kidney stones    passed   Hyperlipidemia associated with type 2 diabetes mellitus ([place])    Hypertension    Osteoporosis    PVC's (premature ventricular contractions)    Type II diabetes mellitus ([place])     Past Surgical History:  Procedure Laterality Date   CATARACT EXTRACTION Right 02/2021   CATARACT EXTRACTION Left 03/2021   COLONOSCOPY WITH ESOPHAGOGASTRODUODENOSCOPY (EGD)      History reviewed. No pertinent family history. Social History:  reports that she has never smoked. She has never used smokeless tobacco. She reports that she does not drink alcohol. No history on file for drug use.  Allergies:  Allergies  Allergen Reactions   Sulfa Antibiotics Anaphylaxis   Sulfites Anaphylaxis    Itching, diarrhea, headaches    Enalapril Maleate     Unknown reaction   Other     Tape BUT tolerates paper tape   Quinapril     Unknown reaction   Rosuvastatin     Unknown reaction   Ampicillin Itching and Rash    Medications Prior to Admission  Medication Sig Dispense Refill   acetic acid-hydrocortisone (VOSOL-HC) OTIC solution Place 3 drops into both ears 2 (two) times daily as needed (itching).     aspirin EC 325 MG tablet Take 975 mg by mouth daily as needed for  moderate pain.     aspirin EC 81 MG tablet Take 81 mg by mouth daily. Swallow whole.     atenolol (TENORMIN) 100 MG tablet Take 100 mg by mouth daily.     budesonide (ENTOCORT EC) 3 MG 24 hr capsule Take 3 mg by mouth daily.     buPROPion (WELLBUTRIN XL) 150 MG 24 hr tablet Take 300 mg by mouth daily.     butalbital-aspirin-caffeine (FIORINAL) 50-325-40 MG capsule Take 1-2 capsules by mouth every 4 (four) hours as needed for migraine.     chlorpheniramine (CHLOR-TRIMETON) 4 MG tablet Take 4 mg by mouth 2 (two) times daily as needed for allergies.     chlorthalidone (HYGROTON) 25 MG tablet Take 25 mg by mouth daily.     Cyanocobalamin (B-12) 2500 MCG TABS Take 2,500 mcg by mouth daily.     doxycycline (PERIOSTAT) 20 MG tablet Take 40 mg by mouth daily as needed (rosacea flare).     ezetimibe (ZETIA) 10 MG tablet Take 10 mg by mouth daily.     famotidine (PEPCID) 40 MG tablet Take 40 mg by mouth daily.     HYDROcodone-acetaminophen (NORCO/VICODIN) 5-325 MG tablet Take 1 tablet by mouth every 6 (six) hours as needed for pain.     insulin aspart (NOVOLOG) 100 UNIT/ML FlexPen Inject 5-15 Units into the skin 4 (four) times daily as needed for high blood sugar.     insulin glargine (LANTUS) 100 UNIT/ML  injection Inject 28 Units into the skin at bedtime.     Magnesium 400 MG CAPS Take 400 mg by mouth daily.     Melatonin 5 MG CAPS Take 5 mg by mouth at bedtime as needed (sleep).     mesalamine (PENTASA) 500 MG CR capsule Take 1,000 mg by mouth 4 (four) times daily.     METAMUCIL FIBER PO Take 6 capsules by mouth daily.     metronidazole (NORITATE) 1 % cream Apply 1 application topically 2 (two) times daily.     Multiple Vitamin (MULTIVITAMIN ADULT PO) Take 1 tablet by mouth daily.     olmesartan (BENICAR) 40 MG tablet Take 40 mg by mouth daily.     omega-3 acid ethyl esters (LOVAZA) 1 g capsule Take 2 g by mouth 2 (two) times daily.     ondansetron (ZOFRAN) 4 MG tablet Take 4 mg by mouth every 8  (eight) hours as needed for vomiting or nausea.     Polyethyl Glycol-Propyl Glycol (SYSTANE OP) Place 1 drop into both eyes 2 (two) times daily.     pravastatin (PRAVACHOL) 40 MG tablet Take 40 mg by mouth daily.     Pyridoxine HCl (B-6) 100 MG TABS Take 100 mg by mouth daily.     sucralfate (CARAFATE) 1 g tablet Take 1 g by mouth 2 (two) times daily.     TIADYLT ER 240 MG 24 hr capsule Take 240 mg by mouth daily.     triamcinolone (KENALOG) 0.1 % Apply 1 application topically daily.     Blood Glucose Monitoring Suppl (GLUCOCOM BLOOD GLUCOSE MONITOR) DEVI Please provide glucometer- One Touch or whatever brand is covered by insurance.     Insulin Syringe-Needle U-100 31G X 5/16" 1 ML MISC 3 (three) times daily.     UNABLE TO FIND Med Name: [place] Night Kit      Results for orders placed or performed during the hospital encounter of 06/21/21 (from the past 48 hour(s))  Glucose, capillary     Status: Abnormal   Collection Time: 06/21/21  1:56 PM  Result Value Ref Range   Glucose-Capillary 174 (H) 70 - 99 mg/dL    Comment: Glucose reference range applies only to samples taken after fasting for at least 8 hours.   Comment 1 Notify RN    Comment 2 Document in Chart    DG MINI C-ARM IMAGE ONLY  Result Date: 06/21/2021 There is no interpretation for this exam.  This order is for images obtained during a surgical procedure.  Please See "Surgeries" Tab for more information regarding the procedure.    Review of Systems  Respiratory: Negative.    Cardiovascular: Negative.    Blood pressure (!) 175/83, pulse 68, temperature (!) 97.5 F (36.4 C), temperature source Oral, resp. rate 20, height 5' 4.5" (1.638 m), weight 64.9 kg, SpO2 97 %. Physical Exam displaced ulna fracture.  This is an olecranon fracture intra-articular with wide displacement and significant injury process.  Unfortunately there is a high degree of fragmentation.  I discussed with patient all issues plans and concerns.  We  will plan for open reduction internal fixation.  She has clear breath sounds.  Abdomen is nontender.  Lower extremity examination has been stable but she does have a history of restless leg syndrome which appears to be worsened after the block administration for her right upper extremity today.  She has been evaluated by anesthesia regarding this.  She is conversant and I discussed with her the  plan of care and she desires to move forward.  We will plan for reconstruction of her right elbow  Assessment/Plan We will plan for open reduction term fixation.  I discussed her all risk-benefit profiles.  I discussed her anesthetic risk and other issues as germane to the upper extremity predicament.  We will move forward accordingly.  We are planning surgery for your upper extremity. The risk and benefits of surgery to include risk of bleeding, infection, anesthesia,  damage to normal structures and failure of the surgery to accomplish its intended goals of relieving symptoms and restoring function have been discussed in detail. With this in mind we plan to proceed. I have specifically discussed with the patient the pre-and postoperative regime and the dos and don'ts and risk and benefits in great detail. Risk and benefits of surgery also include risk of dystrophy(CRPS), chronic nerve pain, failure of the healing process to go onto completion and other inherent risks of surgery The relavent the pathophysiology of the disease/injury process, as well as the alternatives for treatment and postoperative course of action has been discussed in great detail with the patient who desires to proceed.  We will do everything in our power to help you (the patient) restore function to the upper extremity. It is a pleasure to see this patient today.   Willa Frater III, MD 06/21/2021, 3:13 PM

## 2021-06-21 NOTE — Transfer of Care (Signed)
Immediate Anesthesia Transfer of Care Note  Patient: Sarah Key  Procedure(s) Performed: OPEN REDUCTION INTERNAL FIXATION (ORIF) ELBOW/OLECRANON FRACTURE (Right: Elbow) ULNAR NERVE DECOMPRESSION and BURSECTOMY (Right: Elbow)  Patient Location: PACU  Anesthesia Type:General and Regional  Level of Consciousness: drowsy  Airway & Oxygen Therapy: Patient Spontanous Breathing  Post-op Assessment: Report given to RN and Post -op Vital signs reviewed and stable  Post vital signs: Reviewed and stable  Last Vitals:  Vitals Value Taken Time  BP    Temp    Pulse    Resp 12   SpO2 93     Last Pain:  Vitals:   06/21/21 1417  TempSrc:   PainSc: 1          Complications: No notable events documented.

## 2021-06-21 NOTE — Anesthesia Postprocedure Evaluation (Signed)
Anesthesia Post Note  Patient: Chelcea Vanvoorhis  Procedure(s) Performed: OPEN REDUCTION INTERNAL FIXATION (ORIF) ELBOW/OLECRANON FRACTURE (Right: Elbow) ULNAR NERVE DECOMPRESSION and BURSECTOMY (Right: Elbow)     Patient location during evaluation: PACU Anesthesia Type: Regional and General Level of consciousness: awake Pain management: pain level controlled Vital Signs Assessment: post-procedure vital signs reviewed and stable Respiratory status: spontaneous breathing Cardiovascular status: stable Postop Assessment: no apparent nausea or vomiting Anesthetic complications: no   No notable events documented.  Last Vitals:  Vitals:   06/21/21 1735 06/21/21 1750  BP: (!) 157/63 (!) 182/70  Pulse: 76 74  Resp: 17 15  Temp:    SpO2: 95% 96%    Last Pain:  Vitals:   06/21/21 1720  TempSrc:   PainSc: 0-No pain                 Caren Macadam

## 2021-06-21 NOTE — Discharge Instructions (Signed)
Please elevate your arm as best as you can.  You will not be able to bend the elbow within your bandage.  The bandages to protect the repairs.  Please try not to place excessive weight on the back of your elbow where the incision is located.  This will protect the healing of the incision.  Please remember to move and massage her fingers as this decreases swelling which is very expected after an injury like this.  Please take your antibiotics as directed and use the other pain medicine prescription as directed and as needed.  You will feel a lot of discomfort the first 3 to 4 days after the surgery  If you have any emergencies you may call the office or Dr. Amanda Pea cell phone at [phone]  Keep bandage clean and dry.  Call for any problems.  No smoking.  Criteria for driving a car: you should be off your pain medicine for 7-8 hours, able to drive one handed(confident), thinking clearly and feeling able in your judgement to drive. Continue elevation as it will decrease swelling.  If instructed by MD move your fingers within the confines of the bandage/splint.  Use ice if instructed by your MD. Call immediately for any sudden loss of feeling in your hand/arm or change in functional abilities of the extremity. We recommend that you to take vitamin C 1000 mg a day to promote healing. We also recommend that if you require  pain medicine that you take a stool softener to prevent constipation as most pain medicines will have constipation side effects. We recommend either Peri-Colace or Senokot and recommend that you also consider adding MiraLAX as well to prevent the constipation affects from pain medicine if you are required to use them. These medicines are over the counter and may be purchased at a local pharmacy. A cup of yogurt and a probiotic can also be helpful during the recovery process as the medicines can disrupt your intestinal environment.

## 2021-06-21 NOTE — Anesthesia Procedure Notes (Signed)
Anesthesia Regional Block: Supraclavicular block   Pre-Anesthetic Checklist: , timeout performed,  Correct Patient, Correct Site, Correct Laterality,  Correct Procedure, Correct Position, site marked,  Risks and benefits discussed,  Surgical consent,  Pre-op evaluation,  At surgeon's request and post-op pain management  Laterality: Right  Prep: Maximum Sterile Barrier Precautions used, chloraprep       Needles:  Injection technique: Single-shot  Needle Type: Echogenic Stimulator Needle     Needle Length: 9cm  Needle Gauge: 22     Additional Needles:   Procedures:,,,, ultrasound used (permanent image in chart),,    Narrative:  Start time: 06/21/2021 2:40 PM End time: 06/21/2021 2:43 PM Injection made incrementally with aspirations every 5 mL.  Performed by: Personally  Anesthesiologist: Elmer Picker, MD  Additional Notes: Monitors applied. No increased pain on injection. No increased resistance to injection. Injection made in 5cc increments. Good needle visualization. Patient tolerated procedure well.

## 2021-06-21 NOTE — Op Note (Signed)
Operative note June 21, 2021  Sarah Severin MD  Preoperative diagnosis right elbow fracture displaced intra-articular at the olecranon with pain and soft tissue injury  Postop diagnosis the same  Operative procedure #1 ulnar nerve release in situ extensive in nature right elbow #2 extensive bursectomy right elbow #3 open reduction internal fixation with Acumed plate and screw construct and figure of a FiberWire support right elbow/olecranon fracture #4 6 view radiographic series right elbow  Surgeon Sarah Key  Anesthesia LMA general with preoperative block  Tourniquet time less than an hour  Estimated blood loss minimal  Description of procedure: The patient was taken to the procedural suite underwent a general LMA anesthetic.  She was quite restless after her block and does have a history of restless leg syndrome.  Her oxygen saturation level looked excellent and she had no immediate issues or problems.  Due to the restless leg issues and LMA anesthetic was chosen.  She was laid in the sloppy lateral position with axillary roll.  She was prepped with Hibiclens scrub followed by 10-minute surgical Betadine scrub and paint.  Timeout was observed and following this the patient had an extensile incision made posteriorly about the elbow.  Skin flaps were elevated.  An extensive bursectomy was accomplished.  This was done without difficulty with knife and scissor.  Following this I then identified the fracture which was highly comminuted I was very careful to protect the fracture and try not to increase the comminution.  Combination curette and orthopedic instrument was used to remove bloody tissue and following this I placed a provisional K wire in the olecranon tip and secured this anteriorly.  The patient tolerated this quite nicely.  Once this was completed I turned attention towards the ulnar nerve.  Given the comminution medially we performed an ulnar nerve release about the arcade of  Struthers, medial intermuscular septum, 2 heads of the FCU as well as Osborne's ligament and the cubital tunnel itself.  The nerve was not transposed.  It was hyperemic and intact.  Following this we then very carefully and cautiously performed fracture fragment management followed by application of an Acumed plate.  I preplaced FiberWire stitching through holes in the plate x2 long strands so that we could use this as a reinforcement.  The plate was secured the homerun screws in the plate looked excellent and I was very pleased with the purchase and the ability of the plate to fixate the olecranon fracture.  Patient tolerated this well.  There were no complications.  Excellent range of motion was accomplished with good secure fit I crisscrossed the FiberWire's x1 pass and made a box pass with the other set of FiberWire.  This allowed for tension band extra secure fit after the plate was secured.  Patient tolerated this well and I did look at the ulnar nerve the entire time to make sure we did not injure the ulnar nerve.  Following this we irrigated copiously and took final copy x-rays.  Thus ORIF comminuted complex intra-articular olecranon fracture was accomplished with ulnar nerve release and bursectomy.  Hemostasis was secured.  Pulse was excellent refill was excellent and compartments were all soft.  I closed the wound with Prolene and a long-arm sterile dressing was placed followed by posterior splint.  Patient will see [place] back in the office in 2 weeks we will place her in a long-arm splint to be worn at night and begin very gentle motion during the daytime predicated on her ability to  move forward with a orchestrated rehab.  I discussed with patient all issues plans and concerns.  It was a pleasure of dissipate in the operative management of her complex elbow fracture today.  Sarah Weathington MD

## 2021-06-21 NOTE — Anesthesia Procedure Notes (Signed)
Procedure Name: MAC Date/Time: 06/21/2021 3:34 PM Performed by: Dorthea Cove, CRNA Pre-anesthesia Checklist: Patient identified, Emergency Drugs available, Suction available, Timeout performed and Patient being monitored Patient Re-evaluated:Patient Re-evaluated prior to induction Oxygen Delivery Method: Simple face mask Preoxygenation: Pre-oxygenation with 100% oxygen Induction Type: IV induction Placement Confirmation: positive ETCO2 and CO2 detector Dental Injury: Teeth and Oropharynx as per pre-operative assessment

## 2021-06-21 NOTE — Anesthesia Procedure Notes (Signed)
Procedure Name: LMA Insertion Date/Time: 06/21/2021 3:57 PM Performed by: Lelon Perla, CRNA Pre-anesthesia Checklist: Patient identified, Emergency Drugs available, Suction available and Patient being monitored Patient Re-evaluated:Patient Re-evaluated prior to induction Oxygen Delivery Method: Circle System Utilized Preoxygenation: Pre-oxygenation with 100% oxygen Induction Type: IV induction Ventilation: Mask ventilation without difficulty LMA: LMA inserted LMA Size: 4.0 Number of attempts: 1 Airway Equipment and Method: Bite block Placement Confirmation: positive ETCO2 Tube secured with: Tape Dental Injury: Teeth and Oropharynx as per pre-operative assessment

## 2021-06-23 ENCOUNTER — Encounter (HOSPITAL_COMMUNITY): Payer: Self-pay | Admitting: Orthopedic Surgery

## 2021-07-08 ENCOUNTER — Other Ambulatory Visit: Payer: Self-pay | Admitting: Internal Medicine

## 2021-07-08 DIAGNOSIS — G43909 Migraine, unspecified, not intractable, without status migrainosus: Secondary | ICD-10-CM

## 2021-07-27 ENCOUNTER — Other Ambulatory Visit: Payer: Self-pay | Admitting: Internal Medicine

## 2021-08-07 ENCOUNTER — Other Ambulatory Visit: Payer: Self-pay | Admitting: Cardiology

## 2021-10-08 ENCOUNTER — Other Ambulatory Visit: Payer: Self-pay | Admitting: Internal Medicine

## 2021-10-26 ENCOUNTER — Other Ambulatory Visit: Payer: Self-pay | Admitting: Internal Medicine

## 2021-10-26 DIAGNOSIS — G43909 Migraine, unspecified, not intractable, without status migrainosus: Secondary | ICD-10-CM

## 2021-10-31 ENCOUNTER — Other Ambulatory Visit: Payer: Self-pay | Admitting: Internal Medicine

## 2021-11-11 NOTE — Progress Notes (Incomplete)
?Cardiology Office Note:   ? ?Date:  11/11/2021  ? ?IDRomaine Maciolek Key, DOB Jan 10, 1946, MRN [id number] ? ?PCP:  Hoyt Koch, MD  ?Cardiologist:  Buford Dresser, MD ? ?Referring MD: Hoyt Koch, *  ? ?CC: new patient evaluation for PVCs ? ?History of Present Illness:   ? ?Sarah Key is a 76 y.o. female with a hx of type II diabetes, hypertension, hyperlipidemia, PVCs who is seen as a new consult at the request of Hoyt Koch, * for the evaluation and management of PVCs. ? ?I reviewed notes from [facility]. This included hard copies of echo from 06/23/2019 and monitor 05/2019. These are summarized below. ? ?Today: ?Moved to [place], moved into [facility]. Was followed by Dr. Madlyn Frankel in [facility] prior to her move. Reviewed records today.  ? ?Has been followed for PVCs for about 2 years. Very symptomatic with them, felt her heart pounding. ? ?Has struggled with blood pressure control, but now has been stable. Checks BP at home, 120s/60s-70s. No side effects from medications. ? ?Stays active, walks and does exercises daily. Does stretching and light weightlifting as well. No limitations. ? ?Avoids fried food, has a meal plan at [facility]. Would prefer more salads/vegetables but eats as well as she can. ? ?Not sure what other statins she has tried, but doing well on pravastatin and ezetimibe. No known MI/CVA. Diabetes is well controlled ? ?Mother had cardiac arrhythmia. Father died during cardiac surgery at age 26.  ? ?Denies chest pain, shortness of breath at rest or with normal exertion. No PND, orthopnea, LE edema or unexpected weight gain. No syncope or palpitations. ? ?General ROS +hot flashes since menopause, rare hemorrhoid bleeding ? ?Past Medical History:  ?Diagnosis Date  ? Arthritis   ? Depression   ? History of kidney stones   ? passed  ? Hyperlipidemia associated with type 2 diabetes mellitus ([place])   ? Hypertension   ? Osteoporosis   ? PVC's (premature  ventricular contractions)   ? Type II diabetes mellitus ([place])   ? ? ?Past Surgical History:  ?Procedure Laterality Date  ? CATARACT EXTRACTION Right 02/2021  ? CATARACT EXTRACTION Left 03/2021  ? COLONOSCOPY WITH ESOPHAGOGASTRODUODENOSCOPY (EGD)    ? ORIF ELBOW FRACTURE Right 06/21/2021  ? Procedure: OPEN REDUCTION INTERNAL FIXATION (ORIF) ELBOW/OLECRANON FRACTURE;  Surgeon: Roseanne Kaufman, MD;  Location: [place];  Service: Orthopedics;  Laterality: Right;  ? ULNAR NERVE TRANSPOSITION Right 06/21/2021  ? Procedure: ULNAR NERVE DECOMPRESSION and BURSECTOMY;  Surgeon: Roseanne Kaufman, MD;  Location: [place];  Service: Orthopedics;  Laterality: Right;  ? ? ?Current Medications: ?Current Outpatient Medications on File Prior to Visit  ?Medication Sig  ? acetic acid-hydrocortisone (VOSOL-HC) OTIC solution Place 3 drops into both ears 2 (two) times daily as needed (itching).  ? aspirin EC 81 MG tablet Take 81 mg by mouth daily. Swallow whole.  ? atenolol (TENORMIN) 100 MG tablet TAKE ONE TABLET BY MOUTH DAILY  ? Blood Glucose Monitoring Suppl (GLUCOCOM BLOOD GLUCOSE MONITOR) DEVI Please provide glucometer- One Touch or whatever brand is covered by insurance.  ? budesonide (ENTOCORT EC) 3 MG 24 hr capsule Take 3 mg by mouth daily.  ? buPROPion (WELLBUTRIN XL) 150 MG 24 hr tablet Take 300 mg by mouth daily.  ? butalbital-aspirin-caffeine (FIORINAL) 50-325-40 MG capsule TAKE ONE CAPSULE EVERY 6 HOURS AS NEEDED FOR MIGRAINE  ? chlorpheniramine (CHLOR-TRIMETON) 4 MG tablet Take 4 mg by mouth 2 (two) times daily as needed for allergies.  ?  chlorthalidone (HYGROTON) 25 MG tablet Take 25 mg by mouth daily.  ? Cyanocobalamin (B-12) 2500 MCG TABS Take 2,500 mcg by mouth daily.  ? doxycycline (PERIOSTAT) 20 MG tablet Take 40 mg by mouth daily as needed (rosacea flare).  ? ezetimibe (ZETIA) 10 MG tablet TAKE ONE TABLET BY MOUTH ONCE DAILY  ? famotidine (PEPCID) 40 MG tablet Take 40 mg by mouth daily.  ? HYDROcodone-acetaminophen  (NORCO/VICODIN) 5-325 MG tablet Take 1 tablet by mouth every 6 (six) hours as needed for pain.  ? insulin aspart (NOVOLOG) 100 UNIT/ML FlexPen Inject 5-15 Units into the skin 4 (four) times daily as needed for high blood sugar.  ? insulin glargine (LANTUS) 100 UNIT/ML injection Inject 28 Units into the skin at bedtime.  ? Insulin Syringe-Needle U-100 31G X 5/16" 1 ML MISC 3 (three) times daily.  ? Magnesium 400 MG CAPS Take 400 mg by mouth daily.  ? Melatonin 5 MG CAPS Take 5 mg by mouth at bedtime as needed (sleep).  ? mesalamine (PENTASA) 500 MG CR capsule Take 1,000 mg by mouth 4 (four) times daily.  ? METAMUCIL FIBER PO Take 6 capsules by mouth daily.  ? metronidazole (NORITATE) 1 % cream Apply 1 application topically 2 (two) times daily.  ? Multiple Vitamin (MULTIVITAMIN ADULT PO) Take 1 tablet by mouth daily.  ? olmesartan (BENICAR) 40 MG tablet TAKE ONE TABLET BY MOUTH DAILY  ? omega-3 acid ethyl esters (LOVAZA) 1 g capsule Take 2 g by mouth 2 (two) times daily.  ? ondansetron (ZOFRAN) 4 MG tablet Take 4 mg by mouth every 8 (eight) hours as needed for vomiting or nausea.  ? Polyethyl Glycol-Propyl Glycol (SYSTANE OP) Place 1 drop into both eyes 2 (two) times daily.  ? pravastatin (PRAVACHOL) 40 MG tablet Take 40 mg by mouth daily.  ? Pyridoxine HCl (B-6) 100 MG TABS Take 100 mg by mouth daily.  ? sucralfate (CARAFATE) 1 g tablet Take 1 g by mouth 2 (two) times daily.  ? TIADYLT ER 240 MG 24 hr capsule Take 1 capsule (240 mg total) by mouth daily.  ? triamcinolone (KENALOG) 0.1 % Apply 1 application topically daily.  ? UNABLE TO FIND Med Name: [place] Night Kit  ? ?No current facility-administered medications on file prior to visit.  ?  ? ?Allergies:   Sulfa antibiotics, Sulfites, Enalapril maleate, Other, Quinapril, Rosuvastatin, and Ampicillin  ? ?Social History  ? ?Tobacco Use  ? Smoking status: Never  ? Smokeless tobacco: Never  ?Vaping Use  ? Vaping Use: Never used  ?Substance Use Topics  ? Alcohol use:  Never  ? ? ?Family History: ?Mother had cardiac arrhythmia. Father died during cardiac surgery at age 8.  ? ?ROS:   ?Please see the history of present illness.  Additional pertinent ROS: ?Constitutional: Negative for chills, fever, night sweats, unintentional weight loss. Occasional hot flashes ?HENT: Negative for ear pain and hearing loss.   ?Eyes: Negative for loss of vision and eye pain.  ?Respiratory: Negative for cough, sputum, wheezing.   ?Cardiovascular: See HPI. ?Gastrointestinal: Negative for abdominal pain, melena, and hematochezia (only rarely with hemorrhoids).  ?Genitourinary: Negative for dysuria and hematuria.  ?Musculoskeletal: Negative for falls and myalgias.  ?Skin: Negative for itching and rash.  ?Neurological: Negative for focal weakness, focal sensory changes and loss of consciousness.  ?Endo/Heme/Allergies: Does not bruise/bleed easily.   ? ? ?EKGs/Labs/Other Studies Reviewed:   ? ?The following studies were reviewed today: ?Echo 06/23/2019 [facility]) ?LVEF >70%, moderate LVH, could not  assess diastology ?RV normal ?LA mildly dilated, RA normal ?AoV probably trileaflet with mild thickencing, no significant AR/AS ?Mitral valve mildly thickened, no MR, +MAC ?TV normal, trivial TR ?PV poorly seen but no significant abnormalities ?IVC normal, collapses ?No effusion ? ?14 day Zio monitor 05/2019 ?Min HR 48, max HR 187, avg 66 bpm. Sinus predominantly. 3 episodes of NSVT, longest 5 beats, max rate 187 bpm. 7 episodes of SVT, longest 7 beats. PVC burden 1.7%. ? ?EKG:  EKG is personally reviewed.   ?11/14/2021: *** ?11/09/2020: NSR at 70 bpm ? ?Recent Labs: ?No results found for requested labs within last 8760 hours.  ?Recent Lipid Panel ?No results found for: CHOL, TRIG, HDL, CHOLHDL, VLDL, LDLCALC, LDLDIRECT ? ?Physical Exam:   ? ?VS:  There were no vitals taken for this visit.   ? ?Wt Readings from Last 3 Encounters:  ?06/21/21 143 lb (64.9 kg)  ?06/09/21 145 lb (65.8 kg)  ?03/24/21 140 lb 3.2 oz (63.6  kg)  ?  ?GEN: Well nourished, well developed in no acute distress ?HEENT: Normal, moist mucous membranes ?NECK: No JVD ?CARDIAC: regular rhythm, normal S1 and S2, no rubs or gallops. No murmur. ?VASCULAR: Radial and DP

## 2021-11-14 ENCOUNTER — Ambulatory Visit (HOSPITAL_BASED_OUTPATIENT_CLINIC_OR_DEPARTMENT_OTHER): Payer: Medicare PPO | Admitting: Cardiology

## 2021-12-02 ENCOUNTER — Ambulatory Visit (HOSPITAL_BASED_OUTPATIENT_CLINIC_OR_DEPARTMENT_OTHER): Payer: Medicare PPO | Admitting: Cardiology

## 2021-12-08 ENCOUNTER — Encounter: Payer: Self-pay | Admitting: Internal Medicine

## 2021-12-08 ENCOUNTER — Ambulatory Visit (INDEPENDENT_AMBULATORY_CARE_PROVIDER_SITE_OTHER): Payer: Medicare PPO | Admitting: Internal Medicine

## 2021-12-08 VITALS — BP 122/74 | HR 68 | Resp 18 | Ht 64.5 in | Wt 146.8 lb

## 2021-12-08 DIAGNOSIS — G43909 Migraine, unspecified, not intractable, without status migrainosus: Secondary | ICD-10-CM

## 2021-12-08 DIAGNOSIS — G43109 Migraine with aura, not intractable, without status migrainosus: Secondary | ICD-10-CM

## 2021-12-08 DIAGNOSIS — E1169 Type 2 diabetes mellitus with other specified complication: Secondary | ICD-10-CM | POA: Diagnosis not present

## 2021-12-08 DIAGNOSIS — Z794 Long term (current) use of insulin: Secondary | ICD-10-CM

## 2021-12-08 DIAGNOSIS — H9190 Unspecified hearing loss, unspecified ear: Secondary | ICD-10-CM | POA: Diagnosis not present

## 2021-12-08 DIAGNOSIS — Z1231 Encounter for screening mammogram for malignant neoplasm of breast: Secondary | ICD-10-CM

## 2021-12-08 MED ORDER — BUTALBITAL-ASPIRIN-CAFFEINE 50-325-40 MG PO CAPS: ORAL_CAPSULE | ORAL | 5 refills | Status: DC

## 2021-12-08 MED ORDER — ONDANSETRON HCL 4 MG PO TABS: 4.0000 mg | ORAL_TABLET | Freq: Three times a day (TID) | ORAL | 6 refills | Status: DC | PRN

## 2021-12-08 NOTE — Progress Notes (Signed)
? ?  Subjective:  ? ?Patient ID: Sarah Key, female    DOB: August 28, 1946, 76 y.o.   MRN: [id number] ? ?HPI ?The patient is a 76 YO female coming in for several concerns.  ? ?Review of Systems  ?Constitutional: Negative.   ?HENT:  Positive for hearing loss.   ?Eyes: Negative.   ?Respiratory:  Negative for cough, chest tightness and shortness of breath.   ?Cardiovascular:  Negative for chest pain, palpitations and leg swelling.  ?Gastrointestinal:  Negative for abdominal distention, abdominal pain, constipation, diarrhea, nausea and vomiting.  ?Musculoskeletal:  Positive for arthralgias and myalgias.  ?Skin: Negative.   ?Neurological:  Positive for headaches.  ?Psychiatric/Behavioral: Negative.    ? ?Objective:  ?Physical Exam ?Constitutional:   ?   Appearance: She is well-developed.  ?HENT:  ?   Head: Normocephalic and atraumatic.  ?Cardiovascular:  ?   Rate and Rhythm: Normal rate and regular rhythm.  ?Pulmonary:  ?   Effort: Pulmonary effort is normal. No respiratory distress.  ?   Breath sounds: Normal breath sounds. No wheezing or rales.  ?Abdominal:  ?   General: Bowel sounds are normal. There is no distension.  ?   Palpations: Abdomen is soft.  ?   Tenderness: There is no abdominal tenderness. There is no rebound.  ?Musculoskeletal:     ?   General: Tenderness present.  ?   Cervical back: Normal range of motion.  ?Skin: ?   General: Skin is warm and dry.  ?Neurological:  ?   Mental Status: She is alert and oriented to person, place, and time.  ?   Coordination: Coordination normal.  ? ? ?Vitals:  ? 12/08/21 1059  ?BP: 122/74  ?Pulse: 68  ?Resp: 18  ?SpO2: 95%  ?Weight: 146 lb 12.8 oz (66.6 kg)  ?Height: 5' 4.5" (1.638 m)  ? ? ?This visit occurred during the SARS-CoV-2 public health emergency.  Safety protocols were in place, including screening questions prior to the visit, additional usage of staff PPE, and extensive cleaning of exam room while observing appropriate contact time as indicated for  disinfecting solutions.  ? ?Assessment & Plan:  ? ?

## 2021-12-08 NOTE — Patient Instructions (Addendum)
We have refilled the migraine medicine. ? ?We will get you in with endo and the mammogram and audiology. ?

## 2021-12-09 DIAGNOSIS — G43909 Migraine, unspecified, not intractable, without status migrainosus: Secondary | ICD-10-CM | POA: Insufficient documentation

## 2021-12-09 DIAGNOSIS — H9190 Unspecified hearing loss, unspecified ear: Secondary | ICD-10-CM | POA: Insufficient documentation

## 2021-12-09 NOTE — Assessment & Plan Note (Signed)
Referral to audiology for hearing assessment. 

## 2021-12-09 NOTE — Assessment & Plan Note (Addendum)
Takes fiorinal rarely for migraines. Typically triggered by weather fronts. Rx fiorinal and did discuss ubrelvy for possible alternative treatment. She has tried triptans years ago without adequate relief and excessive drowsiness. Rx zofran for associated nausea during migraine. ?

## 2021-12-09 NOTE — Assessment & Plan Note (Signed)
Needs a new endocrinologist so referral done today. She is at goal and sugars should be coming down due to stopped the budesonide for her colitis which is now under good control. She is diet controlled. On statin.  ?

## 2021-12-15 ENCOUNTER — Ambulatory Visit: Payer: Medicare PPO | Admitting: Audiologist

## 2021-12-24 ENCOUNTER — Other Ambulatory Visit: Payer: Self-pay | Admitting: Internal Medicine

## 2021-12-24 DIAGNOSIS — E782 Mixed hyperlipidemia: Secondary | ICD-10-CM

## 2021-12-28 ENCOUNTER — Ambulatory Visit: Payer: Medicare PPO | Attending: Audiologist | Admitting: Audiologist

## 2021-12-28 DIAGNOSIS — H903 Sensorineural hearing loss, bilateral: Secondary | ICD-10-CM | POA: Insufficient documentation

## 2021-12-28 NOTE — Procedures (Signed)
?  [facility] ?[address] ?[place], [place]  [address] ?[phone] ? ?AUDIOLOGICAL  EVALUATION ? ?NAME: Sarah Key     ?DOB:   1945-10-14      ?MRN: [id number]                                                                                     ?DATE: 12/28/2021     ?REFERENT: Hoyt Koch, MD ?STATUS: Outpatient ?DIAGNOSIS: Sensorineural Hearing Loss Bilateral   ? ?History: ?Dayonna was seen for an audiological evaluation due to occasional difficulty hearing. Jaquala has hearing loss, her hearing has been tested twice before. Her last hearing test was several years ago showing mild hearing loss. Jimmia denies any pain, pressure, or tinnitus. She feels she may have some difficulty hearing, mostly in background noise. Medical history shows history of diabetes which can be a risk factor for hearing loss.  ? ?Evaluation:  ?Otoscopy showed a clear view of the tympanic membranes, bilaterally ?Tympanometry results were consistent with normal middle ear function, bilaterally   ?Audiometric testing was completed using Conventional Audiometry techniques with insert earphones and TDH headphones. Test results are consistent with mild hearing loss in the high frequencies. Speech Recognition Thresholds were obtained at  30dB HL in the right ear and at 30dB HL in the left ear. Word Recognition Testing was completed at 40 dB SL and Pheonix scored 100% in each ear.   ? ?Results:  ?The test results were reviewed with [facility]. Her hearing loss is reltively stable. She still has a mild hearing loss in each ear. She is not a candidate for hearing aids at this time. Recommend hearing be tested annually to monitor hearing loss progression.  ? ?Recommendations: ?1.   Recommend annual hearing tests to monitor mild bilateral sensorineural hearing loss.  ? ?If you have any questions please feel free to contact me at [phone]. ? ?Alfonse Alpers  ?Audiologist, Au.D.,  CCC-A ?12/28/2021  3:34 PM ? ?Cc: Hoyt Koch, MD  ?

## 2021-12-29 ENCOUNTER — Ambulatory Visit (HOSPITAL_BASED_OUTPATIENT_CLINIC_OR_DEPARTMENT_OTHER): Payer: Medicare PPO | Admitting: Cardiology

## 2022-01-02 ENCOUNTER — Encounter: Payer: Self-pay | Admitting: Internal Medicine

## 2022-01-03 ENCOUNTER — Ambulatory Visit
Admission: RE | Admit: 2022-01-03 | Discharge: 2022-01-03 | Disposition: A | Discharge status: Home / Self Care | Payer: Medicare PPO | Source: Ambulatory Visit | Attending: Internal Medicine | Admitting: Internal Medicine

## 2022-01-03 DIAGNOSIS — Z1231 Encounter for screening mammogram for malignant neoplasm of breast: Secondary | ICD-10-CM

## 2022-01-04 ENCOUNTER — Telehealth: Payer: Self-pay | Admitting: Gastroenterology

## 2022-01-04 NOTE — Telephone Encounter (Signed)
Hi Dr. Tarri Glenn, ? ? ?Patient called requesting a transfer of care over to you from [facility]. Said she just moved to [place] and would need a GI provider here. ? ?Records are available through [facility] ? ?Please advise on scheduling. ? ?Thanks ? ? ? ? ?

## 2022-01-10 ENCOUNTER — Ambulatory Visit (HOSPITAL_BASED_OUTPATIENT_CLINIC_OR_DEPARTMENT_OTHER): Payer: Medicare PPO | Admitting: Cardiology

## 2022-01-10 ENCOUNTER — Encounter (HOSPITAL_BASED_OUTPATIENT_CLINIC_OR_DEPARTMENT_OTHER): Payer: Self-pay | Admitting: Cardiology

## 2022-01-10 VITALS — BP 102/58 | HR 62 | Ht 64.5 in | Wt 148.3 lb

## 2022-01-10 DIAGNOSIS — Z7189 Other specified counseling: Secondary | ICD-10-CM

## 2022-01-10 DIAGNOSIS — E119 Type 2 diabetes mellitus without complications: Secondary | ICD-10-CM

## 2022-01-10 DIAGNOSIS — E1169 Type 2 diabetes mellitus with other specified complication: Secondary | ICD-10-CM

## 2022-01-10 DIAGNOSIS — I10 Essential (primary) hypertension: Secondary | ICD-10-CM

## 2022-01-10 DIAGNOSIS — Z794 Long term (current) use of insulin: Secondary | ICD-10-CM

## 2022-01-10 DIAGNOSIS — E785 Hyperlipidemia, unspecified: Secondary | ICD-10-CM

## 2022-01-10 NOTE — Progress Notes (Signed)
?Cardiology Office Note:   ? ?Date:  01/10/2022  ? ?ID:  Sarah Key, DOB 1945-12-02, MRN [id number] ? ?PCP:  Hoyt Koch, MD  ?Cardiologist:  Buford Dresser, MD ? ?Referring MD: Hoyt Koch, *  ? ?CC: follow up ? ?History of Present Illness:   ? ?Sarah Key is a 76 y.o. female with a hx of type II diabetes, hypertension, hyperlipidemia, PVCs who is seen for follow up. I intially met her 11/09/20 as a new consult at the request of Hoyt Koch, * for the evaluation and management of PVCs. ? ?Cardiac history: ?Was followed by Dr. Madlyn Frankel in [facility] prior to her move. Has been followed for PVCs for about 2 years. Very symptomatic with them, felt her heart pounding. ? ?Mother had cardiac arrhythmia. Father died during cardiac surgery at age 81.  ? ?Today: ?Doing well. Settling in to her new arrangement at [facility]. Doing exercise with a trainer, doing well.  ? ?Had to climb 4 flights of stairs on vacation recently, had to stop to catch her breath at the top. One episode of brief chest discomfort, self resolved.  ? ?Since last visit, has been diagnosed with colitis. Does not like being on steroids. ? ?Has significant nocturia, less urination during the day. Doesn't sleep well at baseline. ? ?Denies chest pain, shortness of breath at rest or with normal exertion. No PND, orthopnea, LE edema or unexpected weight gain. No syncope. ? ?Past Medical History:  ?Diagnosis Date  ? Arthritis   ? Depression   ? History of kidney stones   ? passed  ? Hyperlipidemia associated with type 2 diabetes mellitus ([place])   ? Hypertension   ? Osteoporosis   ? PVC's (premature ventricular contractions)   ? Type II diabetes mellitus ([place])   ? ? ?Past Surgical History:  ?Procedure Laterality Date  ? CATARACT EXTRACTION Right 02/2021  ? CATARACT EXTRACTION Left 03/2021  ? COLONOSCOPY WITH ESOPHAGOGASTRODUODENOSCOPY (EGD)    ? ORIF ELBOW FRACTURE Right 06/21/2021  ? Procedure: OPEN REDUCTION  INTERNAL FIXATION (ORIF) ELBOW/OLECRANON FRACTURE;  Surgeon: Roseanne Kaufman, MD;  Location: [place];  Service: Orthopedics;  Laterality: Right;  ? ULNAR NERVE TRANSPOSITION Right 06/21/2021  ? Procedure: ULNAR NERVE DECOMPRESSION and BURSECTOMY;  Surgeon: Roseanne Kaufman, MD;  Location: [place];  Service: Orthopedics;  Laterality: Right;  ? ? ?Current Medications: ?Current Outpatient Medications on File Prior to Visit  ?Medication Sig  ? acetic acid-hydrocortisone (VOSOL-HC) OTIC solution Place 3 drops into both ears 2 (two) times daily as needed (itching).  ? aspirin EC 81 MG tablet Take 81 mg by mouth daily. Swallow whole.  ? atenolol (TENORMIN) 100 MG tablet TAKE ONE TABLET BY MOUTH DAILY  ? blood glucose meter kit and supplies by Other route as directed. Dispense based on patient and insurance preference. Use up to four times daily as directed. (FOR ICD-10 E10.9, E11.9).  ? budesonide (ENTOCORT EC) 3 MG 24 hr capsule Take 3 mg by mouth every other day.  ? buPROPion (WELLBUTRIN XL) 150 MG 24 hr tablet Take 300 mg by mouth daily.  ? butalbital-aspirin-caffeine (FIORINAL) 50-325-40 MG capsule TAKE ONE CAPSULE EVERY 6 HOURS AS NEEDED FOR MIGRAINE  ? chlorpheniramine (CHLOR-TRIMETON) 4 MG tablet Take 4 mg by mouth 2 (two) times daily as needed for allergies.  ? chlorthalidone (HYGROTON) 25 MG tablet Take 25 mg by mouth daily.  ? Cyanocobalamin (B-12) 2500 MCG TABS Take 2,500 mcg by mouth daily.  ? doxycycline (PERIOSTAT) 20  MG tablet Take 40 mg by mouth daily as needed (rosacea flare).  ? ezetimibe (ZETIA) 10 MG tablet TAKE ONE TABLET BY MOUTH ONCE DAILY  ? famotidine (PEPCID) 20 MG tablet Take 20 mg by mouth 2 (two) times daily.  ? Insulin Glargine Solostar (LANTUS) 100 UNIT/ML Solostar Pen Inject 28 Units into the skin at bedtime.  ? insulin lispro (HUMALOG) 100 UNIT/ML KwikPen Inject into the skin. Sliding Scale  ? Magnesium 400 MG CAPS Take 400 mg by mouth daily.  ? Melatonin 5 MG CAPS Take 5 mg by mouth at bedtime  as needed (sleep).  ? METAMUCIL FIBER PO Take 6 capsules by mouth daily.  ? metronidazole (NORITATE) 1 % cream Apply 1 application topically 2 (two) times daily.  ? Multiple Vitamin (MULTIVITAMIN ADULT PO) Take 1 tablet by mouth daily.  ? olmesartan (BENICAR) 40 MG tablet TAKE ONE TABLET BY MOUTH DAILY  ? omega-3 acid ethyl esters (LOVAZA) 1 g capsule TAKE TWO CAPSULES BY MOUTH TWICE DAILY.  ? ondansetron (ZOFRAN) 4 MG tablet Take 1 tablet (4 mg total) by mouth every 8 (eight) hours as needed for nausea or vomiting.  ? pravastatin (PRAVACHOL) 40 MG tablet Take 40 mg by mouth daily.  ? Pyridoxine HCl (B-6) 100 MG TABS Take 100 mg by mouth daily.  ? sucralfate (CARAFATE) 1 g tablet Take 1 g by mouth 2 (two) times daily.  ? TIADYLT ER 240 MG 24 hr capsule Take 1 capsule (240 mg total) by mouth daily.  ? triamcinolone (KENALOG) 0.1 % Apply 1 application topically daily.  ? UNABLE TO FIND Med Name: [place]ia Night Kit  ? ?No current facility-administered medications on file prior to visit.  ?  ? ?Allergies:   Sulfa antibiotics, Sulfites, Enalapril maleate, Other, Quinapril, Rosuvastatin, and Ampicillin  ? ?Social History  ? ?Tobacco Use  ? Smoking status: Never  ? Smokeless tobacco: Never  ?Vaping Use  ? Vaping Use: Never used  ?Substance Use Topics  ? Alcohol use: Never  ? ? ?Family History: ?Mother had cardiac arrhythmia. Father died during cardiac surgery at age 47.  ? ?ROS:   ?Please see the history of present illness.  Additional pertinent ROS otherwise unremarkable. ? ?EKGs/Labs/Other Studies Reviewed:   ? ?The following studies were reviewed today: ?Echo 06/23/2019 [facility]) ?LVEF >70%, moderate LVH, could not assess diastology ?RV normal ?LA mildly dilated, RA normal ?AoV probably trileaflet with mild thickencing, no significant AR/AS ?Mitral valve mildly thickened, no MR, +MAC ?TV normal, trivial TR ?PV poorly seen but no significant abnormalities ?IVC normal, collapses ?No effusion ? ?14 day Zio monitor  05/2019 ?Min HR 48, max HR 187, avg 66 bpm. Sinus predominantly. 3 episodes of NSVT, longest 5 beats, max rate 187 bpm. 7 episodes of SVT, longest 7 beats. PVC burden 1.7%. ? ?EKG:  EKG is personally reviewed. ?01/10/22: SR with PVCs at 62 bpm. ? ?Recent Labs: ?No results found for requested labs within last 8760 hours.  ?Recent Lipid Panel ?No results found for: CHOL, TRIG, HDL, CHOLHDL, VLDL, LDLCALC, LDLDIRECT ? ?Physical Exam:   ? ?VS:  BP (!) 102/58 (BP Location: Left Arm, Patient Position: Sitting, Cuff Size: Normal)   Pulse 62   Ht 5' 4.5" (1.638 m)   Wt 148 lb 4.8 oz (67.3 kg)   BMI 25.06 kg/m?    ? ?Wt Readings from Last 3 Encounters:  ?01/10/22 148 lb 4.8 oz (67.3 kg)  ?12/08/21 146 lb 12.8 oz (66.6 kg)  ?06/21/21 143 lb (64.9 kg)  ?  ?  GEN: Well nourished, well developed in no acute distress ?HEENT: Normal, moist mucous membranes ?NECK: No JVD ?CARDIAC: regular rhythm, normal S1 and S2, no rubs or gallops. No murmur. ?VASCULAR: Radial and DP pulses 2+ bilaterally. No carotid bruits ?RESPIRATORY:  Clear to auscultation without rales, wheezing or rhonchi  ?ABDOMEN: Soft, non-tender, non-distended ?MUSCULOSKELETAL:  Ambulates independently ?SKIN: Warm and dry, no edema ?NEUROLOGIC:  Alert and oriented x 3. No focal neuro deficits noted. ?PSYCHIATRIC:  Normal affect   ? ?ASSESSMENT:   ? ?1. Primary hypertension   ?2. Type 2 diabetes mellitus without complication, with long-term current use of insulin ([place])   ?3. Essential hypertension   ?4. Hyperlipidemia associated with type 2 diabetes mellitus ([place])   ?5. Cardiac risk counseling   ?6. Counseling on health promotion and disease prevention   ? ? ?PLAN:   ? ?History of PVCs with NSVT: ?-asymptomatic currently, doing well on medication regimen ?-she is on atenolol 100 mg daily and diltiazem 240 mg daily. I would be concerned about bradycardia given these doses, but she is tolerating well ?-reviewed red flag warning signs that need immediate medical  attention ? ?Type II diabetes on insulin: ?-continue aspirin for primary prevention ?-continue statin as below ? ?Hypertension: ?-continue atenolol and diltiazem as above ?-continue olmesartan 40 mg daily. BP borderline low toda

## 2022-01-10 NOTE — Patient Instructions (Signed)
Medication Instructions:  ?Your Physician recommend you continue on your current medication as directed.   ? ?*If you need a refill on your cardiac medications before your next appointment, please call your pharmacy* ? ? ?Lab Work: ?None ordered today ? ? ?Testing/Procedures: ?None ordered today ? ? ?Follow-Up: ?At [facility], you and your health needs are our priority.  As part of our continuing mission to provide you with exceptional heart care, we have created designated Provider Care Teams.  These Care Teams include your primary Cardiologist (physician) and Advanced Practice Providers (APPs -  Physician Assistants and Nurse Practitioners) who all work together to provide you with the care you need, when you need it. ? ?We recommend signing up for the patient portal called "MyChart".  Sign up information is provided on this After Visit Summary.  MyChart is used to connect with patients for Virtual Visits (Telemedicine).  Patients are able to view lab/test results, encounter notes, upcoming appointments, etc.  Non-urgent messages can be sent to your provider as well.   ?To learn more about what you can do with MyChart, go to [email or web address].   ? ?Your next appointment:   ?1 year(s) ? ?The format for your next appointment:   ?In Person ? ?Provider:   ?Bridgette Christopher, MD{ ? ? ?Important Information About Sugar ? ? ? ? ? ? ?

## 2022-01-11 ENCOUNTER — Encounter: Payer: Self-pay | Admitting: Gastroenterology

## 2022-01-11 NOTE — Telephone Encounter (Signed)
Called patient to advise and schedule left voicemail. 

## 2022-01-31 ENCOUNTER — Other Ambulatory Visit: Payer: Self-pay | Admitting: Internal Medicine

## 2022-01-31 ENCOUNTER — Other Ambulatory Visit: Payer: Self-pay | Admitting: Cardiology

## 2022-01-31 NOTE — Telephone Encounter (Signed)
Rx request sent to pharmacy.  

## 2022-02-12 ENCOUNTER — Other Ambulatory Visit: Payer: Self-pay | Admitting: Cardiology

## 2022-02-13 NOTE — Telephone Encounter (Signed)
Rx(s) sent to pharmacy electronically.  

## 2022-02-14 DIAGNOSIS — E782 Mixed hyperlipidemia: Secondary | ICD-10-CM | POA: Diagnosis not present

## 2022-02-14 DIAGNOSIS — E1169 Type 2 diabetes mellitus with other specified complication: Secondary | ICD-10-CM | POA: Diagnosis not present

## 2022-02-14 DIAGNOSIS — M810 Age-related osteoporosis without current pathological fracture: Secondary | ICD-10-CM | POA: Diagnosis not present

## 2022-02-14 DIAGNOSIS — Z794 Long term (current) use of insulin: Secondary | ICD-10-CM | POA: Diagnosis not present

## 2022-02-14 DIAGNOSIS — I10 Essential (primary) hypertension: Secondary | ICD-10-CM | POA: Diagnosis not present

## 2022-02-14 DIAGNOSIS — Z6824 Body mass index (BMI) 24.0-24.9, adult: Secondary | ICD-10-CM | POA: Diagnosis not present

## 2022-02-15 ENCOUNTER — Ambulatory Visit: Payer: Medicare PPO | Admitting: Gastroenterology

## 2022-02-15 ENCOUNTER — Encounter: Payer: Self-pay | Admitting: Gastroenterology

## 2022-02-15 VITALS — BP 118/50 | HR 76 | Ht 64.5 in | Wt 144.2 lb

## 2022-02-15 DIAGNOSIS — R197 Diarrhea, unspecified: Secondary | ICD-10-CM

## 2022-02-15 DIAGNOSIS — K5100 Ulcerative (chronic) pancolitis without complications: Secondary | ICD-10-CM | POA: Diagnosis not present

## 2022-02-15 MED ORDER — BUDESONIDE 3 MG PO CPEP: 9.0000 mg | ORAL_CAPSULE | ORAL | 0 refills | Status: DC

## 2022-02-15 NOTE — Patient Instructions (Addendum)
It was a pleasure to meet you today.   I recommend that you use the lowest dose of budesonide possible to control your symptoms.  We discussed a fecal calprotectin test. We can use this as a marker for inflammation in the colon. This won't help as much today as it will if you start having diarrhea again.  Avoid aspirin, ibuprofen, Aleve, Naproxen, Goody's, BCs, and other NSAIDs. Use of those medications can make the colitis worse.  I'd like to see you at least every 6-12 months. Please call office to schedule that follow-up.   Please let me if you need anything prior to that time.   Your provider has requested that you go to the basement level for lab work before leaving today. Press "B" on the elevator. The lab is located at the first door on the left as you exit the elevator.  We have sent the following medications to your pharmacy for you to pick up at your convenience: Budesonide   Thank you for choosing me and [facility].  Dr. Orvan Falconer

## 2022-02-15 NOTE — Progress Notes (Addendum)
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   No prior celiac testing labs available.   I recommended that the patient avoid all NSAIDs as this may exacerbate a flare.  There is also some association of worsening symptoms with with proton pump inhibitors, H2 blockers, and SSRIs.    PLAN: - Continue budesonide at 3 mg daily - Fecal calprotectin now, will repeat with any new symptoms - Follow-up every 6-12 months, earlier if needed   HPI: Sarah Key is a 76 y.o. female referred by Dr. Sharlet Salina for further evaluation of colitis.  The history is obtained through the patient and review of her electronic health record including review of care everywhere.  She has a history of depression, hypertension, fatty liver, kidney stones, migraines, osteopenia, hyperlipidemia, PVCs and diabetes. She has had frequent vomiting her entire life and as an adult feels this may be related to migraines. She has moved to [facility] from [facility] and is looking to establish care. She has an Loss adjuster, chartered, PhD, MPH, and worked as a Engineer, mining.   She has been previously followed by Dr. 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  Dr. Gwenyth Ober notes mention  both microscopic colitis and ulcerative pancolitis.  Colitis has been treated with budesonide and Pentasa with use of Imodium as needed.  She was originally resistant to budesonide due to potential steroid effects in the setting of her history of osteoporosis and family history of severe osteoporosis. Her insurance did not want to pay for mesalamine.   She attempted to taper off the budesonide but had a flare within 1 week of discontinuation. She has resumed $RemoveBef'3mg'mucGeIcePd$  daily and her bowel habits have returned to normal. She has a bowel movement every day to every other day. No blood or mucous in the stool. There is no blood or mucous in the stool. No extra-GI manifestations of IBD.   Nausea is attributed to esophagitis and gastritis.  She has been treated with famotidine and sucralfate with Zofran as needed.   Stool studies 11/25/2018 include a normal GI pathogen panel, negative testing for Giardia and Cryptosporidium TSH normal 06/10/2021  No recent abdominal imaging  Endoscopic history: - 08/2015 Colonoscopy screening by Dr. Alain Marion -7 small polyps ,mucosal inflammation ascending colon and sigmoid colon. Polyps 2 adenomas,biopsies increased lamina propria lymphoplasmacytic infiltrate, focal Paneth cell metaplasia and reactive epithelial changes -Potential etiologies include, but are not limited to, drug effect, medication allergies, allergic response to a dietary component, parasitic infection, and inflammatory bowel disease. - 01/04/21 EGD by Dr. Lacey Jensen re nausea,diarrhea:mild esophagitis,gastritis and gastric polyps. Biopsies with mild irritation of stomach and esophagus,benign fundic gland polyps.  Duodenal biopsies negative.  Gastric biopsy showed mild chronic inactive gastritis. - 01/04/21 Colonoscopy by Dr. Norberto Sorenson  re diarrhea: 7 small polyps ,mild cecal inflammation. The polyps 2 adenomas, 2 hyperplastic, 3 inflammatory, biopsies of colon with mildly active colitis in both random biopsies and focal cecal  biopsies.  Pathologist mention that these findings could could be compatible with inflammatory bowel disease but other etiologies such as infection or medication side effects could not be excluded.  Maternal grandfather with colon cancer. There is no other known family history of colon cancer or polyps. No family history of stomach cancer or other GI malignancy. No family history of inflammatory bowel disease or celiac.    Past Medical History:  Diagnosis Date   Arthritis    Depression    History of kidney stones    passed   Hyperlipidemia associated with type 2 diabetes mellitus ([place])    Hypertension    Osteoporosis    PVC's (premature ventricular contractions)    Type II diabetes mellitus ([place])     Past Surgical History:  Procedure Laterality Date   CATARACT EXTRACTION Right 02/2021   CATARACT EXTRACTION Left 03/2021   COLONOSCOPY WITH ESOPHAGOGASTRODUODENOSCOPY (EGD)     ORIF ELBOW FRACTURE Right 06/21/2021   Procedure: OPEN REDUCTION INTERNAL FIXATION (ORIF) ELBOW/OLECRANON FRACTURE;  Surgeon: Roseanne Kaufman, MD;  Location: [place];  Service: Orthopedics;  Laterality: Right;   ULNAR NERVE TRANSPOSITION Right 06/21/2021   Procedure: ULNAR NERVE DECOMPRESSION and BURSECTOMY;  Surgeon: Roseanne Kaufman, MD;  Location: [place];  Service: Orthopedics;  Laterality: Right;      Current Outpatient Medications  Medication Sig Dispense Refill   aspirin EC 81 MG tablet Take 81 mg by mouth daily. Swallow whole.     atenolol (TENORMIN) 100 MG tablet TAKE ONE TABLET BY MOUTH DAILY 90 tablet 1   blood glucose meter kit and supplies by Other route as directed. Dispense based on patient and insurance preference. Use up to four times daily as directed. (FOR ICD-10 E10.9, E11.9).     buPROPion (WELLBUTRIN XL) 150 MG 24 hr tablet Take 300 mg by mouth daily.     butalbital-aspirin-caffeine (FIORINAL) 50-325-40 MG capsule TAKE ONE CAPSULE EVERY 6 HOURS AS NEEDED FOR MIGRAINE 30 capsule 5    chlorthalidone (HYGROTON) 25 MG tablet Take 25 mg by mouth daily.     Cyanocobalamin (B-12) 2500 MCG TABS Take 2,500 mcg by mouth daily.     doxycycline (PERIOSTAT) 20 MG tablet Take 40 mg by mouth daily as needed (rosacea flare).     ezetimibe (ZETIA) 10 MG tablet TAKE ONE TABLET BY MOUTH ONCE DAILY 90 tablet 2   famotidine (PEPCID) 20 MG tablet Take 20 mg by mouth 2 (two) times daily.     fexofenadine (ALLEGRA) 180 MG tablet Take 1 tablet by mouth daily.     Insulin Aspart FlexPen (NOVOLOG) 100 UNIT/ML Inject 30 Units into the skin 3 (three) times daily.     Insulin Glargine Solostar (LANTUS) 100 UNIT/ML Solostar Pen Inject 28 Units into the skin at bedtime.     Magnesium 400 MG CAPS Take 400 mg by mouth daily.     Melatonin 5 MG CAPS Take 5 mg by mouth at bedtime as needed (sleep).     METAMUCIL FIBER PO Take 6 capsules by mouth daily.     metronidazole (NORITATE) 1 % cream Apply 1 application topically 2 (two) times daily.     Multiple Vitamin (MULTIVITAMIN ADULT PO) Take 1 tablet by mouth daily.     olmesartan (BENICAR) 40 MG tablet TAKE ONE TABLET BY MOUTH DAILY 90 tablet  0   omega-3 acid ethyl esters (LOVAZA) 1 g capsule TAKE TWO CAPSULES BY MOUTH TWICE DAILY. 360 capsule 3   ondansetron (ZOFRAN) 4 MG tablet Take 1 tablet (4 mg total) by mouth every 8 (eight) hours as needed for nausea or vomiting. 45 tablet 6   pravastatin (PRAVACHOL) 40 MG tablet TAKE ONE TABLET ONCE DAILY 90 tablet 2   Pyridoxine HCl (B-6) 100 MG TABS Take 100 mg by mouth daily.     sucralfate (CARAFATE) 1 g tablet Take 1 g by mouth 2 (two) times daily.     TIADYLT ER 240 MG 24 hr capsule Take 1 capsule (240 mg total) by mouth daily. 90 capsule 1   triamcinolone (KENALOG) 0.1 % Apply 1 application topically daily.     UNABLE TO FIND Med Name: Freestyle Libre Kit     budesonide (ENTOCORT EC) 3 MG 24 hr capsule Take 3 capsules (9 mg total) by mouth every other day. 180 capsule 0   No current facility-administered  medications for this visit.    Allergies as of 02/15/2022 - Review Complete 02/15/2022  Allergen Reaction Noted   Sulfa antibiotics Anaphylaxis 11/08/2020   Sulfites Anaphylaxis 05/17/2020   Enalapril maleate  11/08/2020   Other  07/21/2019   Quinapril  11/08/2020   Rosuvastatin  11/08/2020   Ampicillin Itching and Rash 11/08/2020    Family History  Problem Relation Age of Onset   Colon cancer Maternal Grandfather    Colon polyps Neg Hx     Social History   Socioeconomic History   Marital status: Married    Spouse name: Not on file   Number of children: Not on file   Years of education: Not on file   Highest education level: Not on file  Occupational History   Not on file  Tobacco Use   Smoking status: Never   Smokeless tobacco: Never  Vaping Use   Vaping Use: Never used  Substance and Sexual Activity   Alcohol use: Never   Drug use: Not on file   Sexual activity: Not on file  Other Topics Concern   Not on file  Social History Narrative   Not on file   Social Determinants of Health   Financial Resource Strain: Not on file  Food Insecurity: Not on file  Transportation Needs: Not on file  Physical Activity: Not on file  Stress: Not on file  Social Connections: Not on file  Intimate Partner Violence: Not on file    Review of Systems: 12 system ROS is negative except as noted above with the addition of allergies, headaches, insomnia. No extra-GI manifestation of IBD.   Physical Exam: General:   Alert,  well-nourished, pleasant and cooperative in NAD Head:  Normocephalic and atraumatic. Eyes:  Sclera clear, no icterus.   Conjunctiva pink. Ears:  Normal auditory acuity. Nose:  No deformity, discharge,  or lesions. Mouth:  No deformity or lesions.   Neck:  Supple; no masses or thyromegaly. Lungs:  Clear throughout to auscultation.   No wheezes. Heart:  Regular rate and rhythm; no murmurs. Abdomen:  Soft, nontender, nondistended, normal bowel sounds, no  rebound or guarding. No hepatosplenomegaly.   Rectal:  Deferred  Msk:  Symmetrical. No boney deformities LAD: No inguinal or umbilical LAD Extremities:  No clubbing or edema. Neurologic:  Alert and  oriented x4;  grossly nonfocal Skin:  Intact without significant lesions or rashes. Psych:  Alert and cooperative. Normal mood and affect.  I spent over 45 minutes,  including in depth chart review including CareEverywhere, independent review of results, communicating results with the patient directly, face-to-face time with the patient, coordinating care, ordering studies and medications as appropriate, and documentation.    Greidy Sherard L. Tarri Glenn, MD, MPH 02/15/2022, 11:32 AM

## 2022-02-27 ENCOUNTER — Other Ambulatory Visit: Payer: Self-pay | Admitting: Cardiology

## 2022-03-06 ENCOUNTER — Other Ambulatory Visit: Payer: Self-pay | Admitting: Internal Medicine

## 2022-03-07 ENCOUNTER — Other Ambulatory Visit: Payer: Medicare PPO

## 2022-03-07 DIAGNOSIS — R197 Diarrhea, unspecified: Secondary | ICD-10-CM

## 2022-03-07 DIAGNOSIS — K5100 Ulcerative (chronic) pancolitis without complications: Secondary | ICD-10-CM

## 2022-03-08 ENCOUNTER — Ambulatory Visit: Payer: Medicare PPO | Admitting: Internal Medicine

## 2022-03-09 ENCOUNTER — Ambulatory Visit: Payer: Medicare PPO | Admitting: Internal Medicine

## 2022-03-09 ENCOUNTER — Encounter: Payer: Self-pay | Admitting: Internal Medicine

## 2022-03-09 DIAGNOSIS — N898 Other specified noninflammatory disorders of vagina: Secondary | ICD-10-CM | POA: Diagnosis not present

## 2022-03-09 LAB — CALPROTECTIN, FECAL: Calprotectin, Fecal: 13 ug/g (ref 0–120)

## 2022-03-09 MED ORDER — FLUCONAZOLE 150 MG PO TABS: 150.0000 mg | ORAL_TABLET | Freq: Once | ORAL | 0 refills | Status: AC

## 2022-03-09 MED ORDER — METRONIDAZOLE 500 MG PO TABS: 500.0000 mg | ORAL_TABLET | Freq: Three times a day (TID) | ORAL | 0 refills | Status: AC

## 2022-03-09 NOTE — Progress Notes (Signed)
   Subjective:   Patient ID: Sarah Key, female    DOB: 1946/02/15, 76 y.o.   MRN: [id number]  HPI The patient is a 76 YO female coming in for vaginal discharge green about 1 week.   Review of Systems  Constitutional: Negative.   HENT: Negative.    Eyes: Negative.   Respiratory:  Negative for cough, chest tightness and shortness of breath.   Cardiovascular:  Negative for chest pain, palpitations and leg swelling.  Gastrointestinal:  Negative for abdominal distention, abdominal pain, constipation, diarrhea, nausea and vomiting.  Genitourinary:  Positive for vaginal discharge.  Musculoskeletal: Negative.   Skin: Negative.   Neurological: Negative.   Psychiatric/Behavioral: Negative.      Objective:  Physical Exam Constitutional:      Appearance: She is well-developed.  HENT:     Head: Normocephalic and atraumatic.  Cardiovascular:     Rate and Rhythm: Normal rate and regular rhythm.  Pulmonary:     Effort: Pulmonary effort is normal. No respiratory distress.     Breath sounds: Normal breath sounds. No wheezing or rales.  Abdominal:     General: Bowel sounds are normal. There is no distension.     Palpations: Abdomen is soft.     Tenderness: There is no abdominal tenderness. There is no rebound.  Musculoskeletal:     Cervical back: Normal range of motion.  Skin:    General: Skin is warm and dry.  Neurological:     Mental Status: She is alert and oriented to person, place, and time.     Coordination: Coordination normal.     Vitals:   03/09/22 1007  BP: 126/78  Pulse: 83  Resp: 18  SpO2: 96%  Weight: 144 lb 6.4 oz (65.5 kg)  Height: 5' 4.5" (1.638 m)    Assessment & Plan:

## 2022-03-09 NOTE — Patient Instructions (Addendum)
We have sent in diflucan to take 1 pill once to cover yeast infection.  We have sent in flagyl (metronidazole) to take 1 pill 3 times a day for 7 days to clear this infection.

## 2022-03-10 DIAGNOSIS — N898 Other specified noninflammatory disorders of vagina: Secondary | ICD-10-CM | POA: Insufficient documentation

## 2022-03-10 NOTE — Assessment & Plan Note (Signed)
Sounds to be BV will treat with flagyl 1 week and cover for possible yeast infection with diflucan. No new partners to require or desire STI screening.

## 2022-04-14 DIAGNOSIS — E119 Type 2 diabetes mellitus without complications: Secondary | ICD-10-CM | POA: Diagnosis not present

## 2022-04-28 ENCOUNTER — Other Ambulatory Visit: Payer: Self-pay | Admitting: Internal Medicine

## 2022-05-06 ENCOUNTER — Other Ambulatory Visit: Payer: Self-pay | Admitting: Internal Medicine

## 2022-05-11 NOTE — Telephone Encounter (Signed)
Patient called back - she still needs her olmesartan - she is completely out.

## 2022-05-15 DIAGNOSIS — H0014 Chalazion left upper eyelid: Secondary | ICD-10-CM | POA: Diagnosis not present

## 2022-06-08 ENCOUNTER — Ambulatory Visit: Payer: Medicare PPO | Admitting: Internal Medicine

## 2022-06-08 ENCOUNTER — Encounter: Payer: Self-pay | Admitting: Internal Medicine

## 2022-06-08 DIAGNOSIS — G2581 Restless legs syndrome: Secondary | ICD-10-CM

## 2022-06-08 MED ORDER — ROPINIROLE HCL 0.25 MG PO TABS: 0.2500 mg | ORAL_TABLET | Freq: Every day | ORAL | 3 refills | Status: DC

## 2022-06-08 NOTE — Progress Notes (Signed)
   Subjective:   Patient ID: Sarah Key, female    DOB: [birth year], 76 y.o.   MRN: [id number]  HPI The patient is a 76 YO female coming in for RLS. Going on many years but worsening and causing sleep problems.   Review of Systems  Constitutional: Negative.   HENT: Negative.    Eyes: Negative.   Respiratory:  Negative for cough, chest tightness and shortness of breath.   Cardiovascular:  Negative for chest pain, palpitations and leg swelling.  Gastrointestinal:  Negative for abdominal distention, abdominal pain, constipation, diarrhea, nausea and vomiting.  Musculoskeletal: Negative.   Skin: Negative.   Neurological: Negative.   Psychiatric/Behavioral:  Positive for sleep disturbance.     Objective:  Physical Exam Constitutional:      Appearance: She is well-developed.  HENT:     Head: Normocephalic and atraumatic.  Cardiovascular:     Rate and Rhythm: Normal rate and regular rhythm.  Pulmonary:     Effort: Pulmonary effort is normal. No respiratory distress.     Breath sounds: Normal breath sounds. No wheezing or rales.  Abdominal:     General: Bowel sounds are normal. There is no distension.     Palpations: Abdomen is soft.     Tenderness: There is no abdominal tenderness. There is no rebound.  Musculoskeletal:     Cervical back: Normal range of motion.  Skin:    General: Skin is warm and dry.  Neurological:     Mental Status: She is alert and oriented to person, place, and time.     Coordination: Coordination normal.     Vitals:   06/08/22 1108  BP: 136/68  Pulse: 60  SpO2: 98%  Weight: 145 lb (65.8 kg)  Height: 5' 4.5" (1.638 m)    Assessment & Plan:

## 2022-06-08 NOTE — Patient Instructions (Signed)
We have sent in the requip 0.25 mg to try at night time

## 2022-06-09 ENCOUNTER — Encounter: Payer: Self-pay | Admitting: Internal Medicine

## 2022-06-09 DIAGNOSIS — G2581 Restless legs syndrome: Secondary | ICD-10-CM | POA: Insufficient documentation

## 2022-06-09 NOTE — Assessment & Plan Note (Signed)
Rx requip 0.25 mg qhs to try. Can adjust dose as needed.

## 2022-06-14 ENCOUNTER — Telehealth: Payer: Self-pay

## 2022-06-14 ENCOUNTER — Ambulatory Visit (INDEPENDENT_AMBULATORY_CARE_PROVIDER_SITE_OTHER): Payer: Medicare PPO

## 2022-06-14 DIAGNOSIS — Z0000 Encounter for general adult medical examination without abnormal findings: Secondary | ICD-10-CM

## 2022-06-14 NOTE — Progress Notes (Addendum)
Virtual Visit via Telephone Note  I connected with  Sarah Key on 06/14/22 at  2:00 PM EDT by telephone and verified that I am speaking with the correct person using two identifiers.  Location: Patient: Home Provider: [place] Persons participating in the virtual visit: [place]   I discussed the limitations, risks, security and privacy concerns of performing an evaluation and management service by telephone and the availability of in person appointments. The patient expressed understanding and agreed to proceed.  Interactive audio and video telecommunications were attempted between this nurse and patient, however failed, due to patient having technical difficulties OR patient did not have access to video capability.  We continued and completed visit with audio only.  Some vital signs may be absent or patient reported.   Sheral Flow, LPN  Subjective:   Sarah Key is a 76 y.o. female who presents for Medicare Annual (Subsequent) preventive examination.  Review of Systems     Cardiac Risk Factors include: advanced age (>5mn, >>62women);dyslipidemia;hypertension     Objective:    There were no vitals filed for this visit. There is no height or weight on file to calculate BMI.     06/14/2022    2:39 PM  Advanced Directives  Does Patient Have a Medical Advance Directive? Yes  Type of AParamedicof ANorthwayLiving will;Out of facility DNR (pink MOST or yellow form)  Does patient want to make changes to medical advance directive? No - Patient declined  Copy of [place] Chart? Yes - validated most recent copy scanned in chart (See row information)  Pre-existing out of facility DNR order (yellow form or pink MOST form) Pink MOST/Yellow Form most recent copy in chart - Physician notified to receive inpatient order    Current Medications (verified) Outpatient Encounter Medications  as of 06/14/2022  Medication Sig   aspirin EC 81 MG tablet Take 81 mg by mouth daily. Swallow whole.   atenolol (TENORMIN) 100 MG tablet TAKE ONE TABLET BY MOUTH DAILY   blood glucose meter kit and supplies by Other route as directed. Dispense based on patient and insurance preference. Use up to four times daily as directed. (FOR ICD-10 E10.9, E11.9).   budesonide (ENTOCORT EC) 3 MG 24 hr capsule Take 3 capsules (9 mg total) by mouth every other day. (Patient taking differently: Take 3 mg by mouth every other day.)   buPROPion (WELLBUTRIN XL) 150 MG 24 hr tablet Take 2 tablets (300 mg total) by mouth every morning.   butalbital-aspirin-caffeine (FIORINAL) 50-325-40 MG capsule TAKE ONE CAPSULE EVERY 6 HOURS AS NEEDED FOR MIGRAINE   chlorthalidone (HYGROTON) 25 MG tablet TAKE ONE TABLET BY MOUTH DAILY   Cyanocobalamin (B-12) 2500 MCG TABS Take 2,500 mcg by mouth daily.   doxycycline (PERIOSTAT) 20 MG tablet Take 40 mg by mouth daily as needed (rosacea flare).   ezetimibe (ZETIA) 10 MG tablet TAKE ONE TABLET BY MOUTH ONCE DAILY   famotidine (PEPCID) 40 MG tablet TAKE ONE TABLET BY MOUTH IN THE MORNING   Insulin Aspart FlexPen (NOVOLOG) 100 UNIT/ML Inject 30 Units into the skin 3 (three) times daily.   Insulin Glargine Solostar (LANTUS) 100 UNIT/ML Solostar Pen Inject 30 Units into the skin at bedtime.   Magnesium 400 MG CAPS Take 400 mg by mouth daily.   Melatonin 5 MG CAPS Take 5 mg by mouth at bedtime as needed (sleep).   METAMUCIL FIBER PO Take 6 capsules by mouth  daily.   metroNIDAZOLE (METROGEL) 1 % gel Apply topically.   Multiple Vitamin (MULTIVITAMIN ADULT PO) Take 1 tablet by mouth daily.   olmesartan (BENICAR) 40 MG tablet TAKE ONE TABLET BY MOUTH DAILY   omega-3 acid ethyl esters (LOVAZA) 1 g capsule TAKE TWO CAPSULES BY MOUTH TWICE DAILY.   ondansetron (ZOFRAN) 4 MG tablet Take 1 tablet (4 mg total) by mouth every 8 (eight) hours as needed for nausea or vomiting.   pravastatin  (PRAVACHOL) 40 MG tablet TAKE ONE TABLET ONCE DAILY   Pyridoxine HCl (B-6) 100 MG TABS Take 100 mg by mouth daily.   rOPINIRole (REQUIP) 0.25 MG tablet Take 1 tablet (0.25 mg total) by mouth at bedtime.   sucralfate (CARAFATE) 1 g tablet Take 1 g by mouth 2 (two) times daily.   TIADYLT ER 240 MG 24 hr capsule Take 1 capsule (240 mg total) by mouth daily.   triamcinolone (KENALOG) 0.1 % Apply 1 application topically daily.   UNABLE TO FIND Med Name: [place] Night Kit   No facility-administered encounter medications on file as of 06/14/2022.    Allergies (verified) Sulfa antibiotics, Sulfites, Enalapril maleate, Other, Quinapril, Rosuvastatin, and Ampicillin   History: Past Medical History:  Diagnosis Date   Arthritis    Depression    History of kidney stones    passed   Hyperlipidemia associated with type 2 diabetes mellitus ([place])    Hypertension    Osteoporosis    PVC's (premature ventricular contractions)    Type II diabetes mellitus ([place])    Past Surgical History:  Procedure Laterality Date   CATARACT EXTRACTION Right 02/2021   CATARACT EXTRACTION Left 03/2021   COLONOSCOPY WITH ESOPHAGOGASTRODUODENOSCOPY (EGD)     ORIF ELBOW FRACTURE Right 06/21/2021   Procedure: OPEN REDUCTION INTERNAL FIXATION (ORIF) ELBOW/OLECRANON FRACTURE;  Surgeon: Roseanne Kaufman, MD;  Location: [facility];  Service: Orthopedics;  Laterality: Right;   ULNAR NERVE TRANSPOSITION Right 06/21/2021   Procedure: ULNAR NERVE DECOMPRESSION and BURSECTOMY;  Surgeon: Roseanne Kaufman, MD;  Location: [facility];  Service: Orthopedics;  Laterality: Right;   Family History  Problem Relation Age of Onset   Colon cancer Maternal Grandfather    Colon polyps Neg Hx    Social History   Socioeconomic History   Marital status: Married    Spouse name: Not on file   Number of children: Not on file   Years of education: Not on file   Highest education level: Not on file  Occupational History   Not on file  Tobacco Use    Smoking status: Never   Smokeless tobacco: Never  Vaping Use   Vaping Use: Never used  Substance and Sexual Activity   Alcohol use: Never   Drug use: Not on file   Sexual activity: Not on file  Other Topics Concern   Not on file  Social History Narrative   Not on file   Social Determinants of Health   Financial Resource Strain: Low Risk  (06/14/2022)   Overall Financial Resource Strain (CARDIA)    Difficulty of Paying Living Expenses: Not hard at all  Food Insecurity: No Food Insecurity (06/14/2022)   Hunger Vital Sign    Worried About Running Out of Food in the Last Year: Never true    [place] in the Last Year: Never true  Transportation Needs: No Transportation Needs (06/14/2022)   PRAPARE - Hydrologist (Medical): No    Lack of Transportation (Non-Medical): No  Physical Activity: Sufficiently Active (06/14/2022)   Exercise Vital Sign    Days of Exercise per Week: 5 days    Minutes of Exercise per Session: 30 min  Stress: No Stress Concern Present (06/14/2022)   [place]    Feeling of Stress : Not at all  Social Connections: [place] (06/14/2022)   Social Connection and Isolation Panel [NHANES]    Frequency of Communication with Friends and Family: More than three times a week    Frequency of Social Gatherings with Friends and Family: More than three times a week    Attends Religious Services: More than 4 times per year    Active Member of [employer] or Organizations: Yes    Attends Music therapist: More than 4 times per year    Marital Status: Married    Tobacco Counseling Counseling given: Not Answered   Clinical Intake:  Pre-visit preparation completed: Yes  Pain : No/denies pain     BMI - recorded: 24.51 Nutritional Status: BMI of 19-24  Normal Nutritional Risks: None Diabetes: No  How often do you need to have someone help  you when you read instructions, pamphlets, or other written materials from your doctor or pharmacy?: 1 - Never What is the last grade level you completed in school?: HSG  Diabetic? prediabetic  Interpreter Needed?: No  Information entered by :: Lisette Abu, LPN.   Activities of Daily Living    06/14/2022    2:39 PM 06/21/2021    2:19 PM  In your present state of health, do you have any difficulty performing the following activities:  Hearing? 0   Vision? 0   Difficulty concentrating or making decisions? 0   Walking or climbing stairs? 0   Dressing or bathing? 0   Doing errands, shopping? 0 0  Preparing Food and eating ? N   Using the Toilet? N   In the past six months, have you accidently leaked urine? N   Do you have problems with loss of bowel control? N   Managing your Medications? N   Managing your Finances? N   Housekeeping or managing your Housekeeping? N     Patient Care Team: Hoyt Koch, MD as PCP - General (Internal Medicine) Buford Dresser, MD as PCP - Cardiology (Cardiology) Katy Apo, MD as Consulting Physician (Ophthalmology)  Indicate any recent Medical Services you may have received from other than [facility] providers in the past year (date may be approximate).     Assessment:   This is a routine wellness examination for [facility].  Hearing/Vision screen Hearing Screening - Comments:: Denies hearing difficulties   Vision Screening - Comments:: Wears rx glasses - up to date with routine eye exams with Katy Apo, MD.   Dietary issues and exercise activities discussed: Current Exercise Habits: Home exercise routine;Structured exercise class, Type of exercise: walking;treadmill;stretching;strength training/weights;exercise ball;calisthenics, Time (Minutes): 30, Frequency (Times/Week): 5, Weekly Exercise (Minutes/Week): 150, Intensity: Moderate, Exercise limited by: None identified   Goals Addressed             This Visit's  Progress    My goal is to lose 4 pounds, increase my walking and join a balance class in January 2024.        Depression Screen    06/14/2022    2:31 PM 03/09/2022   10:17 AM 03/24/2021   10:00 AM  PHQ 2/9 Scores  PHQ - 2 Score 0 0 0  PHQ- 9 Score  2     Fall Risk    06/14/2022    2:27 PM 03/09/2022   10:18 AM  Fall Risk   Falls in the past year? 1 1  Number falls in past yr: 1 1  Injury with Fall? 1 1  Risk for fall due to : History of fall(s);Impaired balance/gait;Orthopedic patient   Follow up Education provided;Falls prevention discussed     FALL RISK PREVENTION PERTAINING TO THE HOME:  Any stairs in or around the home? Yes  If so, are there any without handrails? No  Home free of loose throw rugs in walkways, pet beds, electrical cords, etc? Yes  Adequate lighting in your home to reduce risk of falls? Yes   ASSISTIVE DEVICES UTILIZED TO PREVENT FALLS:  Life alert? Yes  Use of a cane, walker or w/c? No  Grab bars in the bathroom? Yes  Shower chair or bench in shower? No  Elevated toilet seat or a handicapped toilet? Yes   TIMED UP AND GO:  Was the test performed? No . Phone Visit   Cognitive Function:        06/14/2022    2:31 PM  6CIT Screen  What Year? 0 points  What month? 0 points  What time? 0 points  Count back from 20 0 points  Months in reverse 0 points  Repeat phrase 0 points  Total Score 0 points    Immunizations Immunization History  Administered Date(s) Administered   Fluad Quad(high Dose 65+) 05/24/2019, 05/04/2021, 05/12/2022   Hepatitis A, Adult 06/16/2009, 05/22/2011   Influenza, High Dose Seasonal PF 06/04/2014   Influenza, Seasonal, Injecte, Preservative Fre 07/14/2000, 05/18/2011, 05/08/2012, 05/02/2013   Influenza,inj,Quad PF,6+ Mos 04/30/2017, 05/01/2018, 05/23/2019   Influenza,inj,quad, With Preservative 05/10/2016, 05/13/2019   Influenza-Unspecified 05/05/2016, 05/13/2020   Moderna Covid-19 Vaccine Bivalent Booster 34yr  & up 06/03/2022   Moderna Sars-Covid-2 Vaccination 09/29/2019, 10/27/2019, 07/05/2020   PPD Test 05/22/2014   Pneumococcal Conjugate-13 12/11/2013   Pneumococcal Polysaccharide-23 08/23/2010   Pneumococcal-Unspecified 07/13/2015   Respiratory Syncytial Virus Vaccine,Recomb Aduvanted(Arexvy) 04/19/2022   Td 08/28/1996   Tdap 08/31/2006, 11/21/2016   Zoster Recombinat (Shingrix) 12/23/2016, 02/08/2017   Zoster, Live 08/29/2007    TDAP status: Up to date  Flu Vaccine status: Up to date  Pneumococcal vaccine status: Up to date  Covid-19 vaccine status: Completed vaccines  Qualifies for Shingles Vaccine? Yes   Zostavax completed No   Shingrix Completed?: Yes  Screening Tests Health Maintenance  Topic Date Due   HEMOGLOBIN A1C  Never done   OPHTHALMOLOGY EXAM  Never done   Diabetic kidney evaluation - GFR measurement  Never done   Hepatitis C Screening  Never done   DEXA SCAN  Never done   FOOT EXAM  06/09/2022   COVID-19 Vaccine (5 - Moderna series) 10/04/2022   Diabetic kidney evaluation - Urine ACR  02/15/2023   TETANUS/TDAP  11/22/2026   Pneumonia Vaccine 76 Years old  Completed   INFLUENZA VACCINE  Completed   Zoster Vaccines- Shingrix  Completed   HPV VACCINES  Aged Out    Health Maintenance  Health Maintenance Due  Topic Date Due   HEMOGLOBIN A1C  Never done   OPHTHALMOLOGY EXAM  Never done   Diabetic kidney evaluation - GFR measurement  Never done   Hepatitis C Screening  Never done   DEXA SCAN  Never done   FOOT EXAM  06/09/2022    Colorectal cancer screening: No longer required.   Mammogram status: Completed 01/03/2022.  Repeat every year  Bone Density status: never done  Lung Cancer Screening: (Low Dose CT Chest recommended if Age 33-80 years, 30 pack-year currently smoking OR have quit w/in 15years.) does not qualify.   Lung Cancer Screening Referral: no  Additional Screening:  Hepatitis C Screening: does qualify; Completed no  Vision  Screening: Recommended annual ophthalmology exams for early detection of glaucoma and other disorders of the eye. Is the patient up to date with their annual eye exam?  Yes  Who is the provider or what is the name of the office in which the patient attends annual eye exams? Katy Apo, MD. If pt is not established with a provider, would they like to be referred to a provider to establish care? No .   Dental Screening: Recommended annual dental exams for proper oral hygiene  Community Resource Referral / Chronic Care Management: CRR required this visit?  No   CCM required this visit?  No      Plan:     I have personally reviewed and noted the following in the patient's chart:   Medical and social history Use of alcohol, tobacco or illicit drugs  Current medications and supplements including opioid prescriptions. Patient is not currently taking opioid prescriptions. Functional ability and status Nutritional status Physical activity Advanced directives List of other physicians Hospitalizations, surgeries, and ER visits in previous 12 months Vitals Screenings to include cognitive, depression, and falls Referrals and appointments  In addition, I have reviewed and discussed with patient certain preventive protocols, quality metrics, and best practice recommendations. A written personalized care plan for preventive services as well as general preventive health recommendations were provided to patient.     Sheral Flow, LPN   [id number]   Nurse Notes: N/A   Medical screening examination/treatment/procedure(s) were performed by non-physician practitioner and as supervising physician I was immediately available for consultation/collaboration.  I agree with above. Lew Dawes, MD

## 2022-06-14 NOTE — Patient Instructions (Signed)
Sarah Key , Thank you for taking time to come for your Medicare Wellness Visit. I appreciate your ongoing commitment to your health goals. Please review the following plan we discussed and let me know if I can assist you in the future.   These are the goals we discussed:  Goals      My goal is to lose 4 pounds, increase my walking and join a balance class in January 2024.        This is a list of the screening recommended for you and due dates:  Health Maintenance  Topic Date Due   Hemoglobin A1C  Never done   Eye exam for diabetics  Never done   Yearly kidney function blood test for diabetes  Never done   Hepatitis C Screening: USPSTF Recommendation to screen - Ages 1-79 yo.  Never done   DEXA scan (bone density measurement)  Never done   Complete foot exam   06/09/2022   COVID-19 Vaccine (5 - Moderna series) 10/04/2022   Yearly kidney health urinalysis for diabetes  02/15/2023   Tetanus Vaccine  11/22/2026   Pneumonia Vaccine  Completed   Flu Shot  Completed   Zoster (Shingles) Vaccine  Completed   HPV Vaccine  Aged Out    Advanced directives: Yes; document are on file.  Conditions/risks identified: Yes; Goal is to increase walking and join balance class for 2024.  Next appointment: Follow up in one year for your annual wellness visit.   Preventive Care 26 Years and Older, Female Preventive care refers to lifestyle choices and visits with your health care provider that can promote health and wellness. What does preventive care include? A yearly physical exam. This is also called an annual well check. Dental exams once or twice a year. Routine eye exams. Ask your health care provider how often you should have your eyes checked. Personal lifestyle choices, including: Daily care of your teeth and gums. Regular physical activity. Eating a healthy diet. Avoiding tobacco and drug use. Limiting alcohol use. Practicing safe sex. Taking low-dose aspirin every day. Taking  vitamin and mineral supplements as recommended by your health care provider. What happens during an annual well check? The services and screenings done by your health care provider during your annual well check will depend on your age, overall health, lifestyle risk factors, and family history of disease. Counseling  Your health care provider may ask you questions about your: Alcohol use. Tobacco use. Drug use. Emotional well-being. Home and relationship well-being. Sexual activity. Eating habits. History of falls. Memory and ability to understand (cognition). Work and work Astronomer. Reproductive health. Screening  You may have the following tests or measurements: Height, weight, and BMI. Blood pressure. Lipid and cholesterol levels. These may be checked every 5 years, or more frequently if you are over 47 years old. Skin check. Lung cancer screening. You may have this screening every year starting at age 64 if you have a 30-pack-year history of smoking and currently smoke or have quit within the past 15 years. Fecal occult blood test (FOBT) of the stool. You may have this test every year starting at age 43. Flexible sigmoidoscopy or colonoscopy. You may have a sigmoidoscopy every 5 years or a colonoscopy every 10 years starting at age 46. Hepatitis C blood test. Hepatitis B blood test. Sexually transmitted disease (STD) testing. Diabetes screening. This is done by checking your blood sugar (glucose) after you have not eaten for a while (fasting). You may have this done  every 1-3 years. Bone density scan. This is done to screen for osteoporosis. You may have this done starting at age 31. Mammogram. This may be done every 1-2 years. Talk to your health care provider about how often you should have regular mammograms. Talk with your health care provider about your test results, treatment options, and if necessary, the need for more tests. Vaccines  Your health care provider may  recommend certain vaccines, such as: Influenza vaccine. This is recommended every year. Tetanus, diphtheria, and acellular pertussis (Tdap, Td) vaccine. You may need a Td booster every 10 years. Zoster vaccine. You may need this after age 37. Pneumococcal 13-valent conjugate (PCV13) vaccine. One dose is recommended after age 23. Pneumococcal polysaccharide (PPSV23) vaccine. One dose is recommended after age 47. Talk to your health care provider about which screenings and vaccines you need and how often you need them. This information is not intended to replace advice given to you by your health care provider. Make sure you discuss any questions you have with your health care provider. Document Released: 09/10/2015 Document Revised: 05/03/2016 Document Reviewed: 06/15/2015 Elsevier Interactive Patient Education  2017 [place] Prevention in the Home Falls can cause injuries. They can happen to people of all ages. There are many things you can do to make your home safe and to help prevent falls. What can I do on the outside of my home? Regularly fix the edges of walkways and driveways and fix any cracks. Remove anything that might make you trip as you walk through a door, such as a raised step or threshold. Trim any bushes or trees on the path to your home. Use bright outdoor lighting. Clear any walking paths of anything that might make someone trip, such as rocks or tools. Regularly check to see if handrails are loose or broken. Make sure that both sides of any steps have handrails. Any raised decks and porches should have guardrails on the edges. Have any leaves, snow, or ice cleared regularly. Use sand or salt on walking paths during winter. Clean up any spills in your garage right away. This includes oil or grease spills. What can I do in the bathroom? Use night lights. Install grab bars by the toilet and in the tub and shower. Do not use towel bars as grab bars. Use non-skid  mats or decals in the tub or shower. If you need to sit down in the shower, use a plastic, non-slip stool. Keep the floor dry. Clean up any water that spills on the floor as soon as it happens. Remove soap buildup in the tub or shower regularly. Attach bath mats securely with double-sided non-slip rug tape. Do not have throw rugs and other things on the floor that can make you trip. What can I do in the bedroom? Use night lights. Make sure that you have a light by your bed that is easy to reach. Do not use any sheets or blankets that are too big for your bed. They should not hang down onto the floor. Have a firm chair that has side arms. You can use this for support while you get dressed. Do not have throw rugs and other things on the floor that can make you trip. What can I do in the kitchen? Clean up any spills right away. Avoid walking on wet floors. Keep items that you use a lot in easy-to-reach places. If you need to reach something above you, use a strong step stool that has  a grab bar. Keep electrical cords out of the way. Do not use floor polish or wax that makes floors slippery. If you must use wax, use non-skid floor wax. Do not have throw rugs and other things on the floor that can make you trip. What can I do with my stairs? Do not leave any items on the stairs. Make sure that there are handrails on both sides of the stairs and use them. Fix handrails that are broken or loose. Make sure that handrails are as long as the stairways. Check any carpeting to make sure that it is firmly attached to the stairs. Fix any carpet that is loose or worn. Avoid having throw rugs at the top or bottom of the stairs. If you do have throw rugs, attach them to the floor with carpet tape. Make sure that you have a light switch at the top of the stairs and the bottom of the stairs. If you do not have them, ask someone to add them for you. What else can I do to help prevent falls? Wear shoes  that: Do not have high heels. Have rubber bottoms. Are comfortable and fit you well. Are closed at the toe. Do not wear sandals. If you use a stepladder: Make sure that it is fully opened. Do not climb a closed stepladder. Make sure that both sides of the stepladder are locked into place. Ask someone to hold it for you, if possible. Clearly mark and make sure that you can see: Any grab bars or handrails. First and last steps. Where the edge of each step is. Use tools that help you move around (mobility aids) if they are needed. These include: Canes. Walkers. Scooters. Crutches. Turn on the lights when you go into a dark area. Replace any light bulbs as soon as they burn out. Set up your furniture so you have a clear path. Avoid moving your furniture around. If any of your floors are uneven, fix them. If there are any pets around you, be aware of where they are. Review your medicines with your doctor. Some medicines can make you feel dizzy. This can increase your chance of falling. Ask your doctor what other things that you can do to help prevent falls. This information is not intended to replace advice given to you by your health care provider. Make sure you discuss any questions you have with your health care provider. Document Released: 06/10/2009 Document Revised: 01/20/2016 Document Reviewed: 09/18/2014 Elsevier Interactive Patient Education  2017 [employer].

## 2022-06-14 NOTE — Telephone Encounter (Signed)
Called patient no voicemail available.  If patient returns call to office, please reschedule for the next available appointment with NHA or CMA.  -S. Kyanna Mahrt,LPN

## 2022-06-14 NOTE — Telephone Encounter (Signed)
Patient returned call and AWV completed. 

## 2022-06-30 ENCOUNTER — Ambulatory Visit (INDEPENDENT_AMBULATORY_CARE_PROVIDER_SITE_OTHER): Payer: Medicare PPO | Admitting: Internal Medicine

## 2022-06-30 ENCOUNTER — Encounter: Payer: Self-pay | Admitting: Internal Medicine

## 2022-06-30 VITALS — BP 118/60 | HR 56 | Temp 98.1°F | Ht 64.5 in | Wt 145.2 lb

## 2022-06-30 DIAGNOSIS — G2581 Restless legs syndrome: Secondary | ICD-10-CM

## 2022-06-30 DIAGNOSIS — E785 Hyperlipidemia, unspecified: Secondary | ICD-10-CM | POA: Diagnosis not present

## 2022-06-30 DIAGNOSIS — M810 Age-related osteoporosis without current pathological fracture: Secondary | ICD-10-CM | POA: Diagnosis not present

## 2022-06-30 DIAGNOSIS — Z0001 Encounter for general adult medical examination with abnormal findings: Secondary | ICD-10-CM | POA: Diagnosis not present

## 2022-06-30 DIAGNOSIS — E1169 Type 2 diabetes mellitus with other specified complication: Secondary | ICD-10-CM

## 2022-06-30 DIAGNOSIS — Z794 Long term (current) use of insulin: Secondary | ICD-10-CM

## 2022-06-30 MED ORDER — ROPINIROLE HCL 1 MG PO TABS: 1.0000 mg | ORAL_TABLET | Freq: Every day | ORAL | 1 refills | Status: DC

## 2022-06-30 NOTE — Assessment & Plan Note (Signed)
Flu shot up to date. Covid-19 counseled. Pneumonia complete. Shingrix complete. Tetanus up to date. Colonoscopy up to date. Mammogram up to date, pap smear aged out and dexa due 2024. Counseled about sun safety and mole surveillance. Counseled about the dangers of distracted driving. Given 10 year screening recommendations.

## 2022-06-30 NOTE — Assessment & Plan Note (Signed)
Ordered reclast infusion which is due January (will be #3 of 5) and due DEXA after infusion in 2024.Adjust as needed.

## 2022-06-30 NOTE — Assessment & Plan Note (Signed)
Requip is helping and has increased to 0.5 mg qhs with some symptoms still. Will increase to 1 mg qhs and rx done today.

## 2022-06-30 NOTE — Progress Notes (Signed)
   Subjective:   Patient ID: Sarah Key, female    DOB: 1945/12/23, 76 y.o.   MRN: [id number]  HPI The patient is here for physical.  PMH, [facility], social history reviewed and updated  Review of Systems  Constitutional: Negative.   HENT: Negative.    Eyes: Negative.   Respiratory:  Negative for cough, chest tightness and shortness of breath.   Cardiovascular:  Negative for chest pain, palpitations and leg swelling.  Gastrointestinal:  Positive for abdominal pain and diarrhea. Negative for abdominal distention, constipation, nausea and vomiting.  Musculoskeletal:  Positive for arthralgias.  Skin: Negative.   Neurological: Negative.   Psychiatric/Behavioral: Negative.      Objective:  Physical Exam Constitutional:      Appearance: She is well-developed.  HENT:     Head: Normocephalic and atraumatic.  Cardiovascular:     Rate and Rhythm: Normal rate and regular rhythm.  Pulmonary:     Effort: Pulmonary effort is normal. No respiratory distress.     Breath sounds: Normal breath sounds. No wheezing or rales.  Abdominal:     General: Bowel sounds are normal. There is no distension.     Palpations: Abdomen is soft.     Tenderness: There is no abdominal tenderness. There is no rebound.  Musculoskeletal:     Cervical back: Normal range of motion.  Skin:    General: Skin is warm and dry.     Comments: Foot exam done  Neurological:     Mental Status: She is alert and oriented to person, place, and time.     Coordination: Coordination normal.     Vitals:   06/30/22 0945  BP: 118/60  Pulse: (!) 56  Temp: 98.1 F (36.7 C)  TempSrc: Oral  SpO2: 98%  Weight: 145 lb 4 oz (65.9 kg)  Height: 5' 4.5" (1.638 m)    Assessment & Plan:

## 2022-06-30 NOTE — Assessment & Plan Note (Signed)
Foot exam done and counseled about diet. She is doing okay and knows what to do to help sugars. Seeing endo in a few weeks. Will see if they can do lipid panel with labs when they see her to condense lab draws.

## 2022-06-30 NOTE — Patient Instructions (Signed)
We will order the reclast for January

## 2022-06-30 NOTE — Assessment & Plan Note (Signed)
Due for lipid panel and will see if endo can draw when they see patient to condense lab draws. If not we can do at next visit. Continue pravastatin 40 mg daily in the meantime.

## 2022-07-02 ENCOUNTER — Other Ambulatory Visit: Payer: Self-pay | Admitting: Gastroenterology

## 2022-07-07 ENCOUNTER — Other Ambulatory Visit: Payer: Self-pay | Admitting: Internal Medicine

## 2022-07-07 ENCOUNTER — Telehealth: Payer: Self-pay | Admitting: Pharmacy Technician

## 2022-07-07 DIAGNOSIS — G43909 Migraine, unspecified, not intractable, without status migrainosus: Secondary | ICD-10-CM

## 2022-07-07 NOTE — Telephone Encounter (Signed)
Auth Submission: NO AUTH NEEDED RE-VERIFY BENEFITS 08/28/22 Payer: [employer] Medication & CPT/J Code(s) submitted: Reclast (Zolendronic acid) G6440 Route of submission (phone, fax, portal):  Phone # Fax # Auth type: Buy/Bill Units/visits requested: X1 Reference number:  Approval from: 07/07/22 to 08/28/22

## 2022-07-10 DIAGNOSIS — L821 Other seborrheic keratosis: Secondary | ICD-10-CM | POA: Diagnosis not present

## 2022-07-10 DIAGNOSIS — L814 Other melanin hyperpigmentation: Secondary | ICD-10-CM | POA: Diagnosis not present

## 2022-07-10 DIAGNOSIS — L565 Disseminated superficial actinic porokeratosis (DSAP): Secondary | ICD-10-CM | POA: Diagnosis not present

## 2022-07-10 DIAGNOSIS — D1801 Hemangioma of skin and subcutaneous tissue: Secondary | ICD-10-CM | POA: Diagnosis not present

## 2022-07-10 DIAGNOSIS — L718 Other rosacea: Secondary | ICD-10-CM | POA: Diagnosis not present

## 2022-07-10 DIAGNOSIS — L298 Other pruritus: Secondary | ICD-10-CM | POA: Diagnosis not present

## 2022-07-10 DIAGNOSIS — Z85828 Personal history of other malignant neoplasm of skin: Secondary | ICD-10-CM | POA: Diagnosis not present

## 2022-07-23 ENCOUNTER — Other Ambulatory Visit: Payer: Self-pay | Admitting: Cardiology

## 2022-07-23 ENCOUNTER — Other Ambulatory Visit: Payer: Self-pay | Admitting: Internal Medicine

## 2022-07-24 NOTE — Telephone Encounter (Signed)
Rx request sent to pharmacy.  

## 2022-07-27 ENCOUNTER — Other Ambulatory Visit: Payer: Self-pay | Admitting: Internal Medicine

## 2022-07-27 ENCOUNTER — Encounter: Payer: Self-pay | Admitting: Internal Medicine

## 2022-07-27 ENCOUNTER — Ambulatory Visit: Payer: Medicare PPO | Admitting: Internal Medicine

## 2022-07-27 VITALS — BP 124/72 | HR 63 | Ht 64.5 in | Wt 147.0 lb

## 2022-07-27 DIAGNOSIS — E1165 Type 2 diabetes mellitus with hyperglycemia: Secondary | ICD-10-CM | POA: Diagnosis not present

## 2022-07-27 DIAGNOSIS — Z794 Long term (current) use of insulin: Secondary | ICD-10-CM | POA: Diagnosis not present

## 2022-07-27 DIAGNOSIS — M810 Age-related osteoporosis without current pathological fracture: Secondary | ICD-10-CM

## 2022-07-27 LAB — LIPID PANEL
Cholesterol: 128 mg/dL (ref 0–200)
HDL: 45.4 mg/dL (ref >39.00)
LDL Cholesterol: 43 mg/dL (ref 0–99)
NonHDL: 82.77
Total CHOL/HDL Ratio: 3
Triglycerides: 198 mg/dL — ABNORMAL HIGH (ref 0.0–149.0)
VLDL: 39.6 mg/dL (ref 0.0–40.0)

## 2022-07-27 LAB — POCT GLYCOSYLATED HEMOGLOBIN (HGB A1C): Hemoglobin A1C: 8.2 % — AB (ref 4.0–5.6)

## 2022-07-27 LAB — BASIC METABOLIC PANEL
BUN: 15 mg/dL (ref 6–23)
CO2: 30 mEq/L (ref 19–32)
Calcium: 9.2 mg/dL (ref 8.4–10.5)
Chloride: 98 mEq/L (ref 96–112)
Creatinine, Ser: 0.91 mg/dL (ref 0.40–1.20)
GFR: 61.04 mL/min (ref >60.00)
Glucose, Bld: 186 mg/dL — ABNORMAL HIGH (ref 70–99)
Potassium: 4.7 mEq/L (ref 3.5–5.1)
Sodium: 133 mEq/L — ABNORMAL LOW (ref 135–145)

## 2022-07-27 LAB — MICROALBUMIN / CREATININE URINE RATIO
Creatinine,U: 51.5 mg/dL
Microalb Creat Ratio: 2.2 mg/g (ref 0.0–30.0)
Microalb, Ur: 1.2 mg/dL (ref 0.0–1.9)

## 2022-07-27 MED ORDER — LANTUS SOLOSTAR 100 UNIT/ML ~~LOC~~ SOPN: 30.0000 [IU] | PEN_INJECTOR | Freq: Every day | SUBCUTANEOUS | 3 refills | Status: DC

## 2022-07-27 MED ORDER — INSULIN ASPART FLEXPEN 100 UNIT/ML ~~LOC~~ SOPN: PEN_INJECTOR | SUBCUTANEOUS | 6 refills | Status: DC

## 2022-07-27 MED ORDER — INSULIN PEN NEEDLE 29G X 5MM MISC: 1.0000 | Freq: Four times a day (QID) | 3 refills | Status: DC

## 2022-07-27 MED ORDER — DEXCOM G7 SENSOR MISC: 1.0000 | 3 refills | Status: DC

## 2022-07-27 NOTE — Patient Instructions (Addendum)
Continue lantus 30 units at bedtime  Continue Novolog insulin to carb ratio 1:8 with each meal  Novolog correctional insulin: ADD extra units on insulin to your meal-time Novolog dose if your blood sugars are higher than 145. Use the scale below to help guide you:   Blood sugar before meal Number of units to inject  Less than 145 0 unit  146 -  170 1 units  171 -  195 2 units  196 -  220 3 units  221 -  245 4 units  246 -  270 5 units  271 -  295 6 units  296 -  320 7 units   HOW TO TREAT LOW BLOOD SUGARS (Blood sugar LESS THAN 70 MG/DL) Please follow the RULE OF 15 for the treatment of hypoglycemia treatment (when your (blood sugars are less than 70 mg/dL)   STEP 1: Take 15 grams of carbohydrates when your blood sugar is low, which includes:  3-4 GLUCOSE TABS  OR 3-4 OZ OF JUICE OR REGULAR SODA OR ONE TUBE OF GLUCOSE GEL    STEP 2: RECHECK blood sugar in 15 MINUTES STEP 3: If your blood sugar is still low at the 15 minute recheck --> then, go back to STEP 1 and treat AGAIN with another 15 grams of carbohydrates.

## 2022-07-27 NOTE — Progress Notes (Signed)
Name: Sarah Key  MRN/ DOB: [id number], 08-26-1946   Age/ Sex: 76 y.o., female    PCP: Hoyt Koch, MD   Reason for Endocrinology Evaluation: Type 2 Diabetes Mellitus     Date of Initial Endocrinology Visit: 07/27/2022     PATIENT IDENTIFIER: Ms. Sarah Key is a 76 y.o. female with a past medical history of DM, HTN, RLS  and Osteoporosis . The patient presented for initial endocrinology clinic visit on 07/27/2022 for consultative assistance with her diabetes management.    HPI: [name] was    Diagnosed with DM years ago  Prior Medications tried/Intolerance: does not recall . Has been on insulin since 2004.  Currently checking blood sugars mutlipe x / day Hypoglycemia episodes : no                Hemoglobin A1c has ranged from 7.4% in 2023, peaking at 8.9% in 2022. ast 1 year from hyper or hypoglycemia:   In terms of diet, the patient eats 3 meals a day, rare snacking. Avoids sugar sweetened beverages   GAD-65 and IA2 undetectable   She is on budesonide for colitis  Has chronic nausea as well    OSTEOPOROSIS HISTORY  Dx 2021 with a T score -2.5  Zoledronic acid 08/2020 and 1 /2023 Intolerant to Alendronate due to GI  Mother with osteoporosis  Had a right wrist fracture (2002)  and elbow fracture ( 2022)     HOME DIABETES REGIMEN: Lantus 30 units daily  Novolog 1:8 TIDQAC CF : Novolog (BG-100/50)  MVI    Statin: yes ACE-I/ARB: no   CONTINUOUS GLUCOSE MONITORING RECORD INTERPRETATION    Dates of Recording: 11/17 - 07/27/2022  Sensor description: Freestyle libre  Results statistics:   CGM use % of time 91%  Average and SD 177/25.4  Time in range      64%  % Time Above 180 27  % Time above 250 9  % Time Below target 0   Glycemic patterns summary: BG's are optimal overnight, hyperglycemia noted during the day  Hyperglycemic episodes postprandial  Hypoglycemic episodes occurred N/A  Overnight periods:  Optimal   DIABETIC COMPLICATIONS: Microvascular complications:   Denies: CKD ,retinopathy , neuropathy  Last eye exam: Completed 2022, scheduled 07/2022  Macrovascular complications:   Denies: CAD, PVD, CVA   PAST HISTORY: Past Medical History:  Past Medical History:  Diagnosis Date   Arthritis    Depression    History of kidney stones    passed   Hyperlipidemia associated with type 2 diabetes mellitus ([place])    Hypertension    Osteoporosis    PVC's (premature ventricular contractions)    Type II diabetes mellitus ([place])    Past Surgical History:  Past Surgical History:  Procedure Laterality Date   CATARACT EXTRACTION Right 02/2021   CATARACT EXTRACTION Left 03/2021   COLONOSCOPY WITH ESOPHAGOGASTRODUODENOSCOPY (EGD)     ORIF ELBOW FRACTURE Right 06/21/2021   Procedure: OPEN REDUCTION INTERNAL FIXATION (ORIF) ELBOW/OLECRANON FRACTURE;  Surgeon: Roseanne Kaufman, MD;  Location: [facility];  Service: Orthopedics;  Laterality: Right;   ULNAR NERVE TRANSPOSITION Right 06/21/2021   Procedure: ULNAR NERVE DECOMPRESSION and BURSECTOMY;  Surgeon: Roseanne Kaufman, MD;  Location: [place];  Service: Orthopedics;  Laterality: Right;    Social History:  reports that she has never smoked. She has never used smokeless tobacco. She reports that she does not drink alcohol. No history on file for drug use. Family History:  Family History  Problem  Relation Age of Onset   Colon cancer Maternal Grandfather    Colon polyps Neg Hx      HOME MEDICATIONS: Allergies as of 07/27/2022       Reactions   Sulfa Antibiotics Anaphylaxis   Sulfites Anaphylaxis   Itching, diarrhea, headaches   Enalapril Maleate    Unknown reaction   Other    Tape BUT tolerates paper tape   Quinapril    Unknown reaction   Rosuvastatin    Unknown reaction   Ampicillin Itching, Rash        Medication List        Accurate as of July 27, 2022 10:40 AM. If you have any questions, ask your nurse or doctor.           aspirin EC 81 MG tablet Take 81 mg by mouth daily. Swallow whole.   atenolol 100 MG tablet Commonly known as: TENORMIN TAKE ONE TABLET BY MOUTH DAILY   B-12 2500 MCG Tabs Take 2,500 mcg by mouth daily.   B-6 100 MG Tabs Take 100 mg by mouth daily.   blood glucose meter kit and supplies by Other route as directed. Dispense based on patient and insurance preference. Use up to four times daily as directed. (FOR ICD-10 E10.9, E11.9).   budesonide 3 MG 24 hr capsule Commonly known as: ENTOCORT EC Take 3 capsules (9 mg total) by mouth every other day. What changed: how much to take   buPROPion 150 MG 24 hr tablet Commonly known as: WELLBUTRIN XL Take 2 tablets (300 mg total) by mouth every morning.   butalbital-aspirin-caffeine 50-325-40 MG capsule Commonly known as: FIORINAL TAKE ONE CAPSULE EVERY 6 HOURS AS NEEDED FOR MIGRAINE   chlorthalidone 25 MG tablet Commonly known as: HYGROTON TAKE ONE TABLET BY MOUTH DAILY   diltiazem 240 MG 24 hr capsule Commonly known as: CARDIZEM CD Take 240 mg by mouth daily.   doxycycline 20 MG tablet Commonly known as: PERIOSTAT Take 40 mg by mouth daily as needed (rosacea flare).   ezetimibe 10 MG tablet Commonly known as: ZETIA TAKE ONE TABLET BY MOUTH ONCE DAILY   famotidine 40 MG tablet Commonly known as: PEPCID TAKE ONE TABLET BY MOUTH IN THE MORNING   Insulin Aspart FlexPen 100 UNIT/ML Commonly known as: NOVOLOG Inject 30 Units into the skin 3 (three) times daily. Uses sliding scale   Insulin Glargine Solostar 100 UNIT/ML Solostar Pen Commonly known as: LANTUS Inject 30 Units into the skin at bedtime.   Magnesium 400 MG Caps Take 400 mg by mouth daily.   Melatonin 5 MG Caps Take 5 mg by mouth at bedtime as needed (sleep).   METAMUCIL FIBER PO Take 6 capsules by mouth daily.   metroNIDAZOLE 1 % gel Commonly known as: METROGEL Apply topically.   MULTIVITAMIN ADULT PO Take 1 tablet by mouth daily.    olmesartan 40 MG tablet Commonly known as: BENICAR TAKE ONE TABLET BY MOUTH DAILY   omega-3 acid ethyl esters 1 g capsule Commonly known as: LOVAZA TAKE TWO CAPSULES BY MOUTH TWICE DAILY.   ondansetron 4 MG tablet Commonly known as: Zofran Take 1 tablet (4 mg total) by mouth every 8 (eight) hours as needed for nausea or vomiting.   pravastatin 40 MG tablet Commonly known as: PRAVACHOL TAKE ONE TABLET ONCE DAILY   rOPINIRole 1 MG tablet Commonly known as: REQUIP Take 1 tablet (1 mg total) by mouth at bedtime.   sucralfate 1 g tablet Commonly known as: CARAFATE Take  1 tablet by mouth two times a day.   Tiadylt ER 240 MG 24 hr capsule Generic drug: diltiazem Take 1 capsule (240 mg total) by mouth daily.   triamcinolone cream 0.1 % Commonly known as: KENALOG Apply 1 application topically daily.   UNABLE TO FIND Med Name: Freestyle Libre Kit         ALLERGIES: Allergies  Allergen Reactions   Sulfa Antibiotics Anaphylaxis   Sulfites Anaphylaxis    Itching, diarrhea, headaches    Enalapril Maleate     Unknown reaction   Other     Tape BUT tolerates paper tape   Quinapril     Unknown reaction   Rosuvastatin     Unknown reaction   Ampicillin Itching and Rash     REVIEW OF SYSTEMS: A comprehensive ROS was conducted with the patient and is negative except as per HPI and below:  Review of Systems  Gastrointestinal:  Positive for diarrhea and nausea.  Neurological:  Negative for tingling.      OBJECTIVE:   VITAL SIGNS: BP 124/72 (BP Location: Left Arm, Patient Position: Sitting, Cuff Size: Small)   Pulse 63   Ht 5' 4.5" (1.638 m)   Wt 147 lb (66.7 kg)   SpO2 98%   BMI 24.84 kg/m    PHYSICAL EXAM:  General: Pt appears well and is in NAD  Neck: General: Supple without adenopathy or carotid bruits. Thyroid: Thyroid size normal.  No goiter or nodules appreciated.   Lungs: Clear with good BS bilat with no rales, rhonchi, or wheezes  Heart: RRR    Abdomen:  soft, nontender  Extremities:  Lower extremities - No pretibial edema. No lesions.  Neuro: MS is good with appropriate affect, pt is alert and Ox3     DATA REVIEWED:   Latest Reference Range & Units 07/27/22 11:44  Sodium 135 - 145 mEq/L 133 (L)  Potassium 3.5 - 5.1 mEq/L 4.7  Chloride 96 - 112 mEq/L 98  CO2 19 - 32 mEq/L 30  Glucose 70 - 99 mg/dL 186 (H)  BUN 6 - 23 mg/dL 15  Creatinine 0.40 - 1.20 mg/dL 0.91  Calcium 8.4 - 10.5 mg/dL 9.2  GFR >60.00 mL/min 61.04  Total CHOL/HDL Ratio  3  Cholesterol 0 - 200 mg/dL 128  HDL Cholesterol >39.00 mg/dL 45.40  LDL (calc) 0 - 99 mg/dL 43  MICROALB/CREAT RATIO 0.0 - 30.0 mg/g 2.2  NonHDL  82.77  Triglycerides 0.0 - 149.0 mg/dL 198.0 (H)  VLDL 0.0 - 40.0 mg/dL 39.6    Latest Reference Range & Units 07/27/22 11:44  Creatinine,U mg/dL 51.5  Microalb, Ur 0.0 - 1.9 mg/dL 1.2  MICROALB/CREAT RATIO 0.0 - 30.0 mg/g 2.2      ASSESSMENT / PLAN / RECOMMENDATIONS:   1) Type 2 Diabetes Mellitus, Poorly controlled, Without complications - Most recent A1c of 8.2 %. Goal A1c < 7.0 %.     -Patient has been noted with hypoglycemia, this has been attributed due to budesonide use for colitis -She is not a candidate for GLP-1 agonist due to ongoing GI issues -Will consider SGLT2 inhibitors if agreeable by the patient in the future -I would not change her basal rate nor prandial insulin at this time but she will be provided with a new correction scale to be used with each meal  -Faxed dexcom prescription , if this is not covered by insurance company we will continue to prescribe freestyle libre - BMP normal   MEDICATIONS: Continue Lantus 30 units  daily Continue NovoLog I:C ratio 1:8  Start correction factor: NovoLog (BG -120/25)  EDUCATION / INSTRUCTIONS: BG monitoring instructions: Patient is instructed to check her blood sugars 3 times a day, before each meal. Call [facility] if: BG persistently < 70  I  reviewed the Rule of 15 for the treatment of hypoglycemia in detail with the patient. Literature supplied.   2) Diabetic complications:  Eye: Does not have known diabetic retinopathy.  Neuro/ Feet: Does not have known diabetic peripheral neuropathy. Renal: Patient does not have known baseline CKD. She is not on an ACEI/ARB at present.  3) osteoporosis:  -This has been managed by her previous endocrinologist -Patient with history of fragility fracture of right wrist and elbow -She had started zoledronic acid in 2022 and 2023, per patient she is scheduled for her third dose in 2024 through her PCPs office -Intolerant to oral bisphosphonate therapy -Will consider repeating DXA scan next year -Continue MVI  4) Dyslipidemia:   -TG above goal but LDL at goal -No changes, will continue to monitor  Medication Continue pravastatin 40 mg daily  Signed electronically by: Mack Guise, MD  [facility]  [address] [place], [place] [address] Phone: [phone] FAX: [phone]   CC: Hoyt Koch, [place] [address] Phone: [phone]  Fax: [phone]    Return to Endocrinology clinic as below: Future Appointments  Date Time Provider [place]  08/03/2022  2:00 PM [facility] None  12/29/2022 10:00 AM Hoyt Koch, MD [facility] None

## 2022-07-28 ENCOUNTER — Other Ambulatory Visit: Payer: Self-pay

## 2022-07-28 ENCOUNTER — Encounter: Payer: Self-pay | Admitting: Internal Medicine

## 2022-07-28 DIAGNOSIS — E1165 Type 2 diabetes mellitus with hyperglycemia: Secondary | ICD-10-CM

## 2022-07-28 MED ORDER — NOVOLOG FLEXPEN 100 UNIT/ML ~~LOC~~ SOPN: PEN_INJECTOR | SUBCUTANEOUS | 6 refills | Status: DC

## 2022-08-02 ENCOUNTER — Telehealth: Payer: Self-pay | Admitting: Nutrition

## 2022-08-02 DIAGNOSIS — E113291 Type 2 diabetes mellitus with mild nonproliferative diabetic retinopathy without macular edema, right eye: Secondary | ICD-10-CM | POA: Diagnosis not present

## 2022-08-02 DIAGNOSIS — H0011 Chalazion right upper eyelid: Secondary | ICD-10-CM | POA: Diagnosis not present

## 2022-08-02 DIAGNOSIS — Z961 Presence of intraocular lens: Secondary | ICD-10-CM | POA: Diagnosis not present

## 2022-08-02 DIAGNOSIS — H52203 Unspecified astigmatism, bilateral: Secondary | ICD-10-CM | POA: Diagnosis not present

## 2022-08-02 LAB — HM DIABETES EYE EXAM

## 2022-08-02 NOTE — Telephone Encounter (Signed)
Patient reports that Dr. Lonzo Cloud gave her a sample of the Dexcom G7 sensors.  She called her insurance company to see if they will authorize this, and they told her that it needs a prior approval.  She is requesting that this be done

## 2022-08-03 ENCOUNTER — Ambulatory Visit (INDEPENDENT_AMBULATORY_CARE_PROVIDER_SITE_OTHER): Payer: Medicare PPO

## 2022-08-03 ENCOUNTER — Encounter: Payer: Self-pay | Admitting: Internal Medicine

## 2022-08-03 VITALS — BP 129/70 | HR 67 | Temp 98.2°F | Resp 16 | Ht 64.5 in | Wt 147.0 lb

## 2022-08-03 DIAGNOSIS — M810 Age-related osteoporosis without current pathological fracture: Secondary | ICD-10-CM

## 2022-08-03 MED ORDER — ZOLEDRONIC ACID 5 MG/100ML IV SOLN
5.0000 mg | Freq: Once | INTRAVENOUS | Status: AC
  Administered 2022-08-03: 5 mg via INTRAVENOUS
  Filled 2022-08-03: qty 100

## 2022-08-03 MED ORDER — ACETAMINOPHEN 325 MG PO TABS
650.0000 mg | ORAL_TABLET | Freq: Once | ORAL | Status: AC
  Administered 2022-08-03: 650 mg via ORAL
  Filled 2022-08-03: qty 2

## 2022-08-03 MED ORDER — DIPHENHYDRAMINE HCL 25 MG PO CAPS
25.0000 mg | ORAL_CAPSULE | Freq: Once | ORAL | Status: AC
  Administered 2022-08-03: 25 mg via ORAL
  Filled 2022-08-03: qty 1

## 2022-08-03 MED ORDER — SODIUM CHLORIDE 0.9 % IV SOLN: INTRAVENOUS | Status: DC

## 2022-08-03 NOTE — Progress Notes (Signed)
Diagnosis: Osteoporosis  Provider:  Chilton Greathouse MD  Procedure: Infusion  IV Type: Peripheral, IV Location: L Antecubital  Reclast (Zolendronic Acid), Dose: 5 mg  Infusion Start Time: 1421  Infusion Stop Time: 1453  Post Infusion IV Care: Observation period completed and Peripheral IV Discontinued  Discharge: Condition: Good, Destination: Home . AVS provided to patient.   Performed by:  Garnette Czech, RN

## 2022-08-08 ENCOUNTER — Other Ambulatory Visit: Payer: Self-pay | Admitting: Internal Medicine

## 2022-08-17 ENCOUNTER — Telehealth: Payer: Self-pay | Admitting: Dietician

## 2022-08-17 ENCOUNTER — Other Ambulatory Visit (HOSPITAL_COMMUNITY): Payer: Self-pay

## 2022-08-17 DIAGNOSIS — Z794 Long term (current) use of insulin: Secondary | ICD-10-CM

## 2022-08-17 DIAGNOSIS — E1169 Type 2 diabetes mellitus with other specified complication: Secondary | ICD-10-CM

## 2022-08-17 MED ORDER — FREESTYLE LIBRE 14 DAY SENSOR MISC: 0 refills | Status: DC

## 2022-08-17 NOTE — Telephone Encounter (Signed)
RX now sent to patient's local pharmacy

## 2022-08-17 NOTE — Telephone Encounter (Signed)
Returned patient call. She will be running out of the [place] 14 day sensors prior to her [employer] on 09/05/2022.  She is unable to be trained earlier secondary to holiday schedules.  She would like a prescription for 1 Sensor for the [place] 14 day sent to [facility].  Will reach out to [employer].  Oran Rein, RD, LDN, CDCES

## 2022-09-04 ENCOUNTER — Encounter: Payer: Medicare PPO | Attending: Internal Medicine | Admitting: Nutrition

## 2022-09-04 DIAGNOSIS — E1165 Type 2 diabetes mellitus with hyperglycemia: Secondary | ICD-10-CM | POA: Insufficient documentation

## 2022-09-11 ENCOUNTER — Other Ambulatory Visit (HOSPITAL_COMMUNITY): Payer: Self-pay

## 2022-09-12 DIAGNOSIS — E1065 Type 1 diabetes mellitus with hyperglycemia: Secondary | ICD-10-CM | POA: Diagnosis not present

## 2022-09-13 NOTE — Patient Instructions (Signed)
Change sensor every 10 days. Call Dexcom help line if questions about how to use the sensor, or if problems with sensor falling off.

## 2022-09-13 NOTE — Progress Notes (Signed)
Patient was trained on the use of the Dexcom G7 cgm.  We discussed the difference between sensor readings and blood sugar readings,and when it will be necessary to do a finger stick blood sugar readings.  She reported good understanding of this. She inserted a sensor into her left upper outer arm and this was linked to her reader.  She did not wish to use her phone.  Says does not carry this with her.  The reader was set with date and time and she had no difficulty using this, and inserting the proper code.  It was paired to the sensor, and she had no final questions.

## 2022-09-14 ENCOUNTER — Other Ambulatory Visit (HOSPITAL_COMMUNITY): Payer: Self-pay

## 2022-09-14 ENCOUNTER — Telehealth: Payer: Self-pay | Admitting: Pharmacy Technician

## 2022-09-14 NOTE — Telephone Encounter (Signed)
Patient has received supplies and no further action is needed.

## 2022-09-14 NOTE — Telephone Encounter (Signed)
Pharmacy Patient Advocate Encounter   Received notification from pt msgs that prior authorization for Dexcom G7 is required/requested.  Per Test Claim: Covered 100%. $0 copay  I see other notes about the pt using Freestyle temporarily. Has a prescription for the Dexcom been sent to the pharmacy? There isn't one attached on the med list, that I see. I would call them to see what kind of rejection they are getting, but they should process through fine.

## 2022-09-15 ENCOUNTER — Other Ambulatory Visit: Payer: Self-pay | Admitting: Gastroenterology

## 2022-09-15 ENCOUNTER — Telehealth: Payer: Self-pay | Admitting: Gastroenterology

## 2022-09-15 MED ORDER — BUDESONIDE 3 MG PO CPEP: 3.0000 mg | ORAL_CAPSULE | Freq: Every day | ORAL | 1 refills | Status: DC

## 2022-09-15 NOTE — Telephone Encounter (Signed)
Inbound call from patient stating that her pharmacy has sent over request to have refill for Budesonide. Patient is requesting refill be sent. Please advise.

## 2022-09-15 NOTE — Telephone Encounter (Signed)
Sent script to pharmacy

## 2022-09-16 DIAGNOSIS — M79675 Pain in left toe(s): Secondary | ICD-10-CM | POA: Diagnosis not present

## 2022-10-02 ENCOUNTER — Telehealth: Payer: Self-pay | Admitting: Gastroenterology

## 2022-10-02 NOTE — Telephone Encounter (Signed)
PT is calling to let us know that her insurance will be faxing over a prior authorization for her refill on budesonide. Please advise

## 2022-10-03 ENCOUNTER — Telehealth: Payer: Self-pay | Admitting: Pharmacy Technician

## 2022-10-03 ENCOUNTER — Other Ambulatory Visit (HOSPITAL_COMMUNITY): Payer: Self-pay

## 2022-10-03 NOTE — Telephone Encounter (Signed)
Patient Advocate Encounter  Prior Authorization for BUDESONIDE 3MG  has been approved.    PA# --- Effective dates: 1.1.24 through 12.31.24   Received notification from [facility] that prior authorization for BUDESONIE 3MG  is required.   PA submitted on 2.6.24 Key [id number] Status is pending

## 2022-10-29 ENCOUNTER — Other Ambulatory Visit: Payer: Self-pay | Admitting: Internal Medicine

## 2022-11-03 ENCOUNTER — Inpatient Hospital Stay (HOSPITAL_COMMUNITY)
Admission: EM | Admit: 2022-11-03 | Discharge: 2022-11-27 | DRG: 189 | Disposition: E | Payer: Medicare PPO | Attending: Internal Medicine | Admitting: Internal Medicine

## 2022-11-03 ENCOUNTER — Emergency Department (HOSPITAL_COMMUNITY): Payer: Medicare PPO

## 2022-11-03 ENCOUNTER — Ambulatory Visit: Payer: Medicare PPO | Admitting: Family Medicine

## 2022-11-03 ENCOUNTER — Encounter (HOSPITAL_COMMUNITY): Payer: Self-pay

## 2022-11-03 VITALS — BP 132/60 | HR 66 | Temp 97.7°F | Ht 64.5 in | Wt 159.0 lb

## 2022-11-03 DIAGNOSIS — I2510 Atherosclerotic heart disease of native coronary artery without angina pectoris: Secondary | ICD-10-CM | POA: Diagnosis not present

## 2022-11-03 DIAGNOSIS — K52832 Lymphocytic colitis: Secondary | ICD-10-CM | POA: Diagnosis present

## 2022-11-03 DIAGNOSIS — E1165 Type 2 diabetes mellitus with hyperglycemia: Secondary | ICD-10-CM | POA: Diagnosis present

## 2022-11-03 DIAGNOSIS — Z9109 Other allergy status, other than to drugs and biological substances: Secondary | ICD-10-CM

## 2022-11-03 DIAGNOSIS — E119 Type 2 diabetes mellitus without complications: Secondary | ICD-10-CM

## 2022-11-03 DIAGNOSIS — G43909 Migraine, unspecified, not intractable, without status migrainosus: Secondary | ICD-10-CM | POA: Diagnosis present

## 2022-11-03 DIAGNOSIS — R0609 Other forms of dyspnea: Secondary | ICD-10-CM | POA: Diagnosis not present

## 2022-11-03 DIAGNOSIS — Z7952 Long term (current) use of systemic steroids: Secondary | ICD-10-CM

## 2022-11-03 DIAGNOSIS — Z515 Encounter for palliative care: Secondary | ICD-10-CM

## 2022-11-03 DIAGNOSIS — M1990 Unspecified osteoarthritis, unspecified site: Secondary | ICD-10-CM | POA: Diagnosis present

## 2022-11-03 DIAGNOSIS — Z1152 Encounter for screening for COVID-19: Secondary | ICD-10-CM | POA: Diagnosis not present

## 2022-11-03 DIAGNOSIS — E861 Hypovolemia: Secondary | ICD-10-CM | POA: Diagnosis present

## 2022-11-03 DIAGNOSIS — K529 Noninfective gastroenteritis and colitis, unspecified: Secondary | ICD-10-CM | POA: Diagnosis not present

## 2022-11-03 DIAGNOSIS — R531 Weakness: Secondary | ICD-10-CM | POA: Diagnosis not present

## 2022-11-03 DIAGNOSIS — Z043 Encounter for examination and observation following other accident: Secondary | ICD-10-CM | POA: Diagnosis not present

## 2022-11-03 DIAGNOSIS — Z87442 Personal history of urinary calculi: Secondary | ICD-10-CM

## 2022-11-03 DIAGNOSIS — Z9181 History of falling: Secondary | ICD-10-CM | POA: Diagnosis not present

## 2022-11-03 DIAGNOSIS — M810 Age-related osteoporosis without current pathological fracture: Secondary | ICD-10-CM | POA: Diagnosis present

## 2022-11-03 DIAGNOSIS — E873 Alkalosis: Secondary | ICD-10-CM | POA: Diagnosis present

## 2022-11-03 DIAGNOSIS — J189 Pneumonia, unspecified organism: Principal | ICD-10-CM | POA: Diagnosis present

## 2022-11-03 DIAGNOSIS — E785 Hyperlipidemia, unspecified: Secondary | ICD-10-CM | POA: Insufficient documentation

## 2022-11-03 DIAGNOSIS — Z880 Allergy status to penicillin: Secondary | ICD-10-CM | POA: Diagnosis not present

## 2022-11-03 DIAGNOSIS — F32A Depression, unspecified: Secondary | ICD-10-CM | POA: Diagnosis present

## 2022-11-03 DIAGNOSIS — Z882 Allergy status to sulfonamides status: Secondary | ICD-10-CM

## 2022-11-03 DIAGNOSIS — R112 Nausea with vomiting, unspecified: Secondary | ICD-10-CM

## 2022-11-03 DIAGNOSIS — G9341 Metabolic encephalopathy: Secondary | ICD-10-CM | POA: Diagnosis present

## 2022-11-03 DIAGNOSIS — I959 Hypotension, unspecified: Secondary | ICD-10-CM | POA: Diagnosis not present

## 2022-11-03 DIAGNOSIS — E1169 Type 2 diabetes mellitus with other specified complication: Secondary | ICD-10-CM | POA: Diagnosis not present

## 2022-11-03 DIAGNOSIS — K7460 Unspecified cirrhosis of liver: Secondary | ICD-10-CM | POA: Diagnosis present

## 2022-11-03 DIAGNOSIS — R059 Cough, unspecified: Secondary | ICD-10-CM | POA: Diagnosis not present

## 2022-11-03 DIAGNOSIS — Z7982 Long term (current) use of aspirin: Secondary | ICD-10-CM

## 2022-11-03 DIAGNOSIS — R0789 Other chest pain: Secondary | ICD-10-CM | POA: Diagnosis not present

## 2022-11-03 DIAGNOSIS — J9601 Acute respiratory failure with hypoxia: Principal | ICD-10-CM | POA: Diagnosis present

## 2022-11-03 DIAGNOSIS — Z888 Allergy status to other drugs, medicaments and biological substances status: Secondary | ICD-10-CM

## 2022-11-03 DIAGNOSIS — Z794 Long term (current) use of insulin: Secondary | ICD-10-CM

## 2022-11-03 DIAGNOSIS — G2581 Restless legs syndrome: Secondary | ICD-10-CM | POA: Diagnosis not present

## 2022-11-03 DIAGNOSIS — J168 Pneumonia due to other specified infectious organisms: Secondary | ICD-10-CM | POA: Diagnosis not present

## 2022-11-03 DIAGNOSIS — R0602 Shortness of breath: Secondary | ICD-10-CM

## 2022-11-03 DIAGNOSIS — S0990XA Unspecified injury of head, initial encounter: Secondary | ICD-10-CM

## 2022-11-03 DIAGNOSIS — R410 Disorientation, unspecified: Secondary | ICD-10-CM

## 2022-11-03 DIAGNOSIS — K5190 Ulcerative colitis, unspecified, without complications: Secondary | ICD-10-CM | POA: Diagnosis present

## 2022-11-03 DIAGNOSIS — R5383 Other fatigue: Secondary | ICD-10-CM | POA: Diagnosis present

## 2022-11-03 DIAGNOSIS — R296 Repeated falls: Secondary | ICD-10-CM

## 2022-11-03 DIAGNOSIS — E871 Hypo-osmolality and hyponatremia: Secondary | ICD-10-CM | POA: Diagnosis present

## 2022-11-03 DIAGNOSIS — I10 Essential (primary) hypertension: Secondary | ICD-10-CM | POA: Diagnosis not present

## 2022-11-03 DIAGNOSIS — Z9842 Cataract extraction status, left eye: Secondary | ICD-10-CM

## 2022-11-03 DIAGNOSIS — I493 Ventricular premature depolarization: Secondary | ICD-10-CM | POA: Diagnosis present

## 2022-11-03 DIAGNOSIS — R42 Dizziness and giddiness: Secondary | ICD-10-CM | POA: Diagnosis not present

## 2022-11-03 DIAGNOSIS — Z66 Do not resuscitate: Secondary | ICD-10-CM | POA: Diagnosis not present

## 2022-11-03 DIAGNOSIS — E876 Hypokalemia: Secondary | ICD-10-CM | POA: Diagnosis not present

## 2022-11-03 DIAGNOSIS — Z79899 Other long term (current) drug therapy: Secondary | ICD-10-CM

## 2022-11-03 DIAGNOSIS — R079 Chest pain, unspecified: Secondary | ICD-10-CM | POA: Diagnosis not present

## 2022-11-03 DIAGNOSIS — Z9841 Cataract extraction status, right eye: Secondary | ICD-10-CM

## 2022-11-03 LAB — BASIC METABOLIC PANEL
Anion gap: 11 (ref 5–15)
BUN: 20 mg/dL (ref 8–23)
CO2: 23 mmol/L (ref 22–32)
Calcium: 7.7 mg/dL — ABNORMAL LOW (ref 8.9–10.3)
Chloride: 85 mmol/L — ABNORMAL LOW (ref 98–111)
Creatinine, Ser: 0.9 mg/dL (ref 0.44–1.00)
GFR, Estimated: >60 mL/min (ref >60)
Glucose, Bld: 204 mg/dL — ABNORMAL HIGH (ref 70–99)
Potassium: 3 mmol/L — ABNORMAL LOW (ref 3.5–5.1)
Sodium: 119 mmol/L — CL (ref 135–145)

## 2022-11-03 LAB — COMPREHENSIVE METABOLIC PANEL
ALT: 35 U/L (ref 0–44)
AST: 38 U/L (ref 15–41)
Albumin: 2.9 g/dL — ABNORMAL LOW (ref 3.5–5.0)
Alkaline Phosphatase: 60 U/L (ref 38–126)
Anion gap: 14 (ref 5–15)
BUN: 24 mg/dL — ABNORMAL HIGH (ref 8–23)
CO2: 20 mmol/L — ABNORMAL LOW (ref 22–32)
Calcium: 7.8 mg/dL — ABNORMAL LOW (ref 8.9–10.3)
Chloride: 85 mmol/L — ABNORMAL LOW (ref 98–111)
Creatinine, Ser: 1.08 mg/dL — ABNORMAL HIGH (ref 0.44–1.00)
GFR, Estimated: 53 mL/min — ABNORMAL LOW (ref >60)
Glucose, Bld: 225 mg/dL — ABNORMAL HIGH (ref 70–99)
Potassium: 2.9 mmol/L — ABNORMAL LOW (ref 3.5–5.1)
Sodium: 119 mmol/L — CL (ref 135–145)
Total Bilirubin: 0.8 mg/dL (ref 0.3–1.2)
Total Protein: 5.7 g/dL — ABNORMAL LOW (ref 6.5–8.1)

## 2022-11-03 LAB — CBG MONITORING, ED
Glucose-Capillary: 227 mg/dL — ABNORMAL HIGH (ref 70–99)
Glucose-Capillary: 252 mg/dL — ABNORMAL HIGH (ref 70–99)

## 2022-11-03 LAB — OSMOLALITY: Osmolality: 261 mOsm/kg — ABNORMAL LOW (ref 275–295)

## 2022-11-03 LAB — TROPONIN I (HIGH SENSITIVITY)
Troponin I (High Sensitivity): 15 ng/L (ref <18)
Troponin I (High Sensitivity): 19 ng/L — ABNORMAL HIGH (ref <18)

## 2022-11-03 LAB — CBC WITH DIFFERENTIAL/PLATELET
Abs Immature Granulocytes: 0.1 10*3/uL — ABNORMAL HIGH (ref 0.00–0.07)
Basophils Absolute: 0 10*3/uL (ref 0.0–0.1)
Basophils Relative: 0 %
Eosinophils Absolute: 0 10*3/uL (ref 0.0–0.5)
Eosinophils Relative: 0 %
HCT: 32.3 % — ABNORMAL LOW (ref 36.0–46.0)
Hemoglobin: 11.4 g/dL — ABNORMAL LOW (ref 12.0–15.0)
Immature Granulocytes: 1 %
Lymphocytes Relative: 8 %
Lymphs Abs: 0.9 10*3/uL (ref 0.7–4.0)
MCH: 28.8 pg (ref 26.0–34.0)
MCHC: 35.3 g/dL (ref 30.0–36.0)
MCV: 81.6 fL (ref 80.0–100.0)
Monocytes Absolute: 1.6 10*3/uL — ABNORMAL HIGH (ref 0.1–1.0)
Monocytes Relative: 13 %
Neutro Abs: 9.4 10*3/uL — ABNORMAL HIGH (ref 1.7–7.7)
Neutrophils Relative %: 78 %
Platelets: 277 10*3/uL (ref 150–400)
RBC: 3.96 MIL/uL (ref 3.87–5.11)
RDW: 12.9 % (ref 11.5–15.5)
WBC: 12.1 10*3/uL — ABNORMAL HIGH (ref 4.0–10.5)
nRBC: 0 % (ref 0.0–0.2)

## 2022-11-03 LAB — POCT GLUCOSE (DEVICE FOR HOME USE): POC Glucose: 216 mg/dl — AB (ref 70–99)

## 2022-11-03 LAB — MAGNESIUM: Magnesium: 2 mg/dL (ref 1.7–2.4)

## 2022-11-03 LAB — RESP PANEL BY RT-PCR (RSV, FLU A&B, COVID)  RVPGX2
Influenza A by PCR: NEGATIVE
Influenza B by PCR: NEGATIVE
Resp Syncytial Virus by PCR: NEGATIVE
SARS Coronavirus 2 by RT PCR: NEGATIVE

## 2022-11-03 LAB — TSH: TSH: 0.414 u[IU]/mL (ref 0.350–4.500)

## 2022-11-03 MED ORDER — SODIUM CHLORIDE 0.9 % IV SOLN
500.0000 mg | Freq: Once | INTRAVENOUS | Status: AC
  Administered 2022-11-03: 500 mg via INTRAVENOUS
  Filled 2022-11-03: qty 5

## 2022-11-03 MED ORDER — BUDESONIDE 3 MG PO CPEP
3.0000 mg | ORAL_CAPSULE | Freq: Every day | ORAL | Status: DC
  Administered 2022-11-04 – 2022-11-07 (×4): 3 mg via ORAL
  Filled 2022-11-03 (×6): qty 1

## 2022-11-03 MED ORDER — SODIUM CHLORIDE 0.9 % IV SOLN
2.0000 g | INTRAVENOUS | Status: DC
  Administered 2022-11-04 – 2022-11-07 (×4): 2 g via INTRAVENOUS
  Filled 2022-11-03 (×4): qty 20

## 2022-11-03 MED ORDER — ROPINIROLE HCL 1 MG PO TABS: 1.0000 mg | ORAL_TABLET | Freq: Every day | ORAL | Status: DC

## 2022-11-03 MED ORDER — FAMOTIDINE 20 MG PO TABS
40.0000 mg | ORAL_TABLET | Freq: Every morning | ORAL | Status: DC
  Administered 2022-11-04 – 2022-11-07 (×4): 40 mg via ORAL
  Filled 2022-11-03 (×5): qty 2

## 2022-11-03 MED ORDER — ASPIRIN 81 MG PO TBEC
81.0000 mg | DELAYED_RELEASE_TABLET | Freq: Every day | ORAL | Status: DC
  Administered 2022-11-04 – 2022-11-07 (×4): 81 mg via ORAL
  Filled 2022-11-03 (×5): qty 1

## 2022-11-03 MED ORDER — DILTIAZEM HCL ER COATED BEADS 240 MG PO CP24
240.0000 mg | ORAL_CAPSULE | Freq: Every day | ORAL | Status: DC
  Administered 2022-11-04 – 2022-11-07 (×4): 240 mg via ORAL
  Filled 2022-11-03 (×6): qty 1

## 2022-11-03 MED ORDER — VITAMIN B-12 1000 MCG PO TABS
2500.0000 ug | ORAL_TABLET | Freq: Every day | ORAL | Status: DC
  Administered 2022-11-04 – 2022-11-07 (×4): 2500 ug via ORAL
  Filled 2022-11-03 (×5): qty 3

## 2022-11-03 MED ORDER — ENOXAPARIN SODIUM 40 MG/0.4ML IJ SOSY
40.0000 mg | PREFILLED_SYRINGE | INTRAMUSCULAR | Status: DC
  Administered 2022-11-03 – 2022-11-07 (×5): 40 mg via SUBCUTANEOUS
  Filled 2022-11-03 (×5): qty 0.4

## 2022-11-03 MED ORDER — INSULIN GLARGINE-YFGN 100 UNIT/ML ~~LOC~~ SOLN: 20.0000 [IU] | Freq: Every day | SUBCUTANEOUS | Status: DC

## 2022-11-03 MED ORDER — SODIUM CHLORIDE 0.9 % IV SOLN
2.0000 g | Freq: Once | INTRAVENOUS | Status: AC
  Administered 2022-11-03: 2 g via INTRAVENOUS
  Filled 2022-11-03: qty 20

## 2022-11-03 MED ORDER — INSULIN GLARGINE-YFGN 100 UNIT/ML ~~LOC~~ SOLN
30.0000 [IU] | Freq: Every day | SUBCUTANEOUS | Status: DC
  Administered 2022-11-04 – 2022-11-07 (×4): 30 [IU] via SUBCUTANEOUS
  Filled 2022-11-03 (×5): qty 0.3

## 2022-11-03 MED ORDER — INSULIN ASPART 100 UNIT/ML IJ SOLN
0.0000 [IU] | Freq: Three times a day (TID) | INTRAMUSCULAR | Status: DC
  Administered 2022-11-04: 8 [IU] via SUBCUTANEOUS
  Administered 2022-11-04: 5 [IU] via SUBCUTANEOUS
  Administered 2022-11-04: 8 [IU] via SUBCUTANEOUS
  Administered 2022-11-05: 5 [IU] via SUBCUTANEOUS
  Administered 2022-11-05: 2 [IU] via SUBCUTANEOUS
  Administered 2022-11-05: 3 [IU] via SUBCUTANEOUS
  Administered 2022-11-06 (×2): 5 [IU] via SUBCUTANEOUS
  Administered 2022-11-07 (×3): 2 [IU] via SUBCUTANEOUS

## 2022-11-03 MED ORDER — LACTATED RINGERS IV BOLUS
1000.0000 mL | Freq: Once | INTRAVENOUS | Status: AC
  Administered 2022-11-03: 1000 mL via INTRAVENOUS

## 2022-11-03 MED ORDER — PRAVASTATIN SODIUM 40 MG PO TABS
40.0000 mg | ORAL_TABLET | Freq: Every day | ORAL | Status: DC
  Administered 2022-11-04 – 2022-11-07 (×4): 40 mg via ORAL
  Filled 2022-11-03 (×5): qty 1

## 2022-11-03 MED ORDER — POTASSIUM CHLORIDE IN NACL 20-0.9 MEQ/L-% IV SOLN
INTRAVENOUS | Status: DC
  Filled 2022-11-03 (×6): qty 1000

## 2022-11-03 MED ORDER — EZETIMIBE 10 MG PO TABS
10.0000 mg | ORAL_TABLET | Freq: Every day | ORAL | Status: DC
  Administered 2022-11-04 – 2022-11-07 (×4): 10 mg via ORAL
  Filled 2022-11-03 (×5): qty 1

## 2022-11-03 MED ORDER — POTASSIUM CHLORIDE CRYS ER 20 MEQ PO TBCR
40.0000 meq | EXTENDED_RELEASE_TABLET | ORAL | Status: AC
  Administered 2022-11-03 – 2022-11-04 (×3): 40 meq via ORAL
  Filled 2022-11-03 (×3): qty 2

## 2022-11-03 MED ORDER — IPRATROPIUM-ALBUTEROL 0.5-2.5 (3) MG/3ML IN SOLN
3.0000 mL | Freq: Once | RESPIRATORY_TRACT | Status: AC
  Administered 2022-11-03: 3 mL via RESPIRATORY_TRACT
  Filled 2022-11-03: qty 3

## 2022-11-03 MED ORDER — SODIUM CHLORIDE 0.9 % IV SOLN
500.0000 mg | INTRAVENOUS | Status: DC
  Administered 2022-11-04: 500 mg via INTRAVENOUS
  Filled 2022-11-03 (×2): qty 5

## 2022-11-03 MED ORDER — GUAIFENESIN 100 MG/5ML PO LIQD
5.0000 mL | ORAL | Status: DC | PRN
  Administered 2022-11-04 – 2022-11-05 (×4): 5 mL via ORAL
  Filled 2022-11-03 (×4): qty 5

## 2022-11-03 MED ORDER — METHYLPREDNISOLONE SODIUM SUCC 125 MG IJ SOLR
125.0000 mg | Freq: Once | INTRAMUSCULAR | Status: AC
  Administered 2022-11-03: 125 mg via INTRAVENOUS
  Filled 2022-11-03: qty 2

## 2022-11-03 MED ORDER — VITAMIN B-6 100 MG PO TABS
100.0000 mg | ORAL_TABLET | Freq: Every day | ORAL | Status: DC
  Administered 2022-11-04 – 2022-11-07 (×4): 100 mg via ORAL
  Filled 2022-11-03 (×6): qty 1

## 2022-11-03 MED ORDER — INSULIN ASPART 100 UNIT/ML IJ SOLN
0.0000 [IU] | Freq: Every day | INTRAMUSCULAR | Status: DC
  Administered 2022-11-03: 3 [IU] via SUBCUTANEOUS
  Administered 2022-11-04: 2 [IU] via SUBCUTANEOUS

## 2022-11-03 MED ORDER — SUCRALFATE 1 G PO TABS
1.0000 g | ORAL_TABLET | Freq: Two times a day (BID) | ORAL | Status: DC
  Administered 2022-11-03 – 2022-11-07 (×8): 1 g via ORAL
  Filled 2022-11-03 (×10): qty 1

## 2022-11-03 MED ORDER — SODIUM CHLORIDE 0.9 % IV BOLUS
1000.0000 mL | Freq: Once | INTRAVENOUS | Status: AC
  Administered 2022-11-03: 1000 mL via INTRAVENOUS

## 2022-11-03 MED ORDER — ATENOLOL 50 MG PO TABS
100.0000 mg | ORAL_TABLET | Freq: Every day | ORAL | Status: DC
  Administered 2022-11-04 – 2022-11-07 (×4): 100 mg via ORAL
  Filled 2022-11-03 (×5): qty 2

## 2022-11-03 MED ORDER — DILTIAZEM HCL ER COATED BEADS 240 MG PO CP24
240.0000 mg | ORAL_CAPSULE | Freq: Every day | ORAL | Status: DC
  Filled 2022-11-03: qty 1

## 2022-11-03 MED ORDER — BENZONATATE 100 MG PO CAPS
100.0000 mg | ORAL_CAPSULE | Freq: Once | ORAL | Status: AC
  Administered 2022-11-03: 100 mg via ORAL
  Filled 2022-11-03: qty 1

## 2022-11-03 MED ORDER — POTASSIUM CHLORIDE CRYS ER 20 MEQ PO TBCR
40.0000 meq | EXTENDED_RELEASE_TABLET | Freq: Once | ORAL | Status: AC
  Administered 2022-11-03: 40 meq via ORAL
  Filled 2022-11-03: qty 2

## 2022-11-03 MED ORDER — IOHEXOL 350 MG/ML SOLN
55.0000 mL | Freq: Once | INTRAVENOUS | Status: AC | PRN
  Administered 2022-11-03: 55 mL via INTRAVENOUS

## 2022-11-03 NOTE — ED Notes (Addendum)
Provider at bedside. Pt placed on 2L [place] due to low o2 sat's while sleeping at 88-89%, pt now at 92% oxygen.

## 2022-11-03 NOTE — ED Notes (Signed)
Pt BG 227, pt self-medicated with Lantus insulin from home, gave herself 35 units that she takes at bedtime. No acute distress noted at this time.

## 2022-11-03 NOTE — Progress Notes (Signed)
Subjective:  Sarah Key is a 77 y.o. female who presents for a 6 day hx of worsening symptoms. Feeling confused, several falls, dizziness, shortness of breath, nausea and vomiting.  Hx of DM, HTN, HLD, colitis.   Denies fever, headache, chest pain, palpitations, diarrhea.     No other aggravating or relieving factors.  No other c/o.  ROS as in subjective.   Objective: Vitals:   10/30/2022 1446  BP: 132/60  Pulse: 66  Temp: 97.7 F (36.5 C)  SpO2: 96%    General appearance: Alert, disoriented,  ill appearing                             Skin: pale, warm, no rash                           Head: mild bruise on right temporal region                            Eyes: conjunctiva normal, corneas clear, PERRLA                                      Mouth/throat: MMM, tongue normal, mild pharyngeal erythema                           Neck: supple, no adenopathy, no thyromegaly, nontender                          Heart: RRR                         Lungs: exp wheezes,no rales, or rhonchi   Neuro: no facial asymmetry or focal weakness. EOMs intact.       Assessment: Disorientation - Plan: POCT Glucose (Device for Home Use)  Multiple falls  Dizziness - Plan: POCT Glucose (Device for Home Use)  Shortness of breath  Nausea and vomiting, unspecified vomiting type  Injury of head, initial encounter   Plan: POCT glucose 216 She is ill appearing. Pale. Unsteady gait. No focal weakness. No sign of stroke currently. She did hit her head with one of her recent falls. Unable to tolerate food or fluids.  Her husband agrees she is confused and needs to go to the hospital for further work up. EMS called.

## 2022-11-03 NOTE — ED Notes (Signed)
Pt report received from previous nurse. Pt A&O x4, vitals stable, denies needs/complaints. Call bell in reach. No acute distress noted.  

## 2022-11-03 NOTE — ED Notes (Signed)
Pt's husband at bedside, call bell in reach, no acute distress noted.

## 2022-11-03 NOTE — H&P (Incomplete)
PCP:   Hoyt Koch, MD   Chief Complaint:  Weakness, rib pain  HPI: This is a 77 year old female with past medical history HLD, DM, HTN, RLS, and depression.  Last Sunday she woke with real severe pain under her bilateral lower ribs.  Pain was worse with each breath.  The pain persisted over the week.  Because she was so uncomfortable with deep breathing, she stayed in bed.  She was very tired.  She denies weight loss, fever, chills, nausea, vomiting, or anorexia.  She endorses some mild dry cough, mild shortness of breath and a possible wheeze.  Today her husband told her they need to do something, she needed to go to the ER.  In the ER CTA chest shows large alveolar infiltrates RUL and other small patchy infiltrates RUl, RUL, lingula. Na 119, K 2.9, WBC 12.1.  Patient afebrile, not tachycardic  Review of Systems:  The patient denies anorexia, fever, weight loss,, vision loss, decreased hearing, hoarseness, chest pain, syncope, dyspnea on exertion, peripheral edema, balance deficits, hemoptysis, abdominal pain, melena, hematochezia, severe indigestion/heartburn, hematuria, incontinence, genital sores, muscle weakness, suspicious skin lesions, transient blindness, difficulty walking, depression, unusual weight change, abnormal bleeding, enlarged lymph nodes, angioedema, and breast masses. Positives: Rib pain, pleuritic chest pain, dry cough  Past Medical History: Past Medical History:  Diagnosis Date   Arthritis    Depression    History of kidney stones    passed   Hyperlipidemia associated with type 2 diabetes mellitus ([place])    Hypertension    Osteoporosis    PVC's (premature ventricular contractions)    Type II diabetes mellitus ([place])    Past Surgical History:  Procedure Laterality Date   CATARACT EXTRACTION Right 02/2021   CATARACT EXTRACTION Left 03/2021   COLONOSCOPY WITH ESOPHAGOGASTRODUODENOSCOPY (EGD)     ORIF ELBOW FRACTURE Right 06/21/2021   Procedure: OPEN  REDUCTION INTERNAL FIXATION (ORIF) ELBOW/OLECRANON FRACTURE;  Surgeon: Roseanne Kaufman, MD;  [facility];  Service: [facility]opedics;  Laterality: Right;   ULNAR NERVE TRANSPOSITION Right 06/21/2021   Procedure: ULNAR NERVE DECOMPRESSION and BURSECTOMY;  Surgeon: Roseanne Kaufman, MD;  Location: [facility];  Service: Orthopedics;  Laterality: Right;    Medications: Prior to Admission medications   Medication Sig Start Date End Date Taking? Authorizing Provider  aspirin EC 81 MG tablet Take 81 mg by mouth daily. Swallow whole.    [provider]  atenolol (TENORMIN) 100 MG tablet TAKE ONE TABLET BY MOUTH DAILY 10/30/22   Hoyt Koch, MD  blood glucose meter kit and supplies by Other route as directed. Dispense based on patient and insurance preference. Use up to four times daily as directed. (FOR ICD-10 E10.9, E11.9).    [provider]  budesonide (ENTOCORT EC) 3 MG 24 hr capsule Take 1 capsule (3 mg total) by mouth daily. 09/15/22   Thornton Park, MD  buPROPion (WELLBUTRIN XL) 150 MG 24 hr tablet Take 2 tablets (300 mg total) by mouth every morning. 03/08/22   Hoyt Koch, MD  butalbital-aspirin-caffeine [facility]) [phone] MG capsule TAKE ONE CAPSULE EVERY 6 HOURS AS NEEDED FOR MIGRAINE 07/10/22   Hoyt Koch, MD  chlorthalidone (HYGROTON) 25 MG tablet TAKE ONE TABLET BY MOUTH DAILY 02/27/22   Buford Dresser, MD  Continuous Blood Gluc Sensor (DEXCOM G7 SENSOR) MISC 1 Device by Does not apply route as directed. 07/27/22   Shamleffer, Melanie Crazier, MD  Continuous Blood Gluc Sensor (FREESTYLE LIBRE 14 DAY SENSOR) MISC Use as instructed, change  every 14 days 08/17/22   Shamleffer, Melanie Crazier, MD  Cyanocobalamin (B-12) 2500 MCG TABS Take 2,500 mcg by mouth daily.    [provider]  diltiazem (CARDIZEM CD) 240 MG 24 hr capsule Take 240 mg by mouth daily.    [provider]  doxycycline (PERIOSTAT) 20 MG tablet Take 40 mg by  mouth daily as needed (rosacea flare). Patient not taking: Reported on 11/02/2022 08/17/14   [provider]  ezetimibe (ZETIA) 10 MG tablet TAKE ONE TABLET BY MOUTH ONCE DAILY 07/24/22   Hoyt Koch, MD  famotidine (PEPCID) 40 MG tablet TAKE ONE TABLET BY MOUTH IN THE MORNING 02/27/22   Buford Dresser, MD  insulin aspart (NOVOLOG FLEXPEN) 100 UNIT/ML FlexPen Max 50 units daily Novolog insulin to carb ratio 1 to 8 with each meal 07/28/22   Shamleffer, Melanie Crazier, MD  insulin glargine (LANTUS SOLOSTAR) 100 UNIT/ML Solostar Pen Inject 30 Units into the skin daily. 07/27/22   Shamleffer, Melanie Crazier, MD  Insulin Pen Needle 29G X 5MM MISC 1 Device by Does not apply route in the morning, at noon, in the evening, and at bedtime. 07/27/22   Shamleffer, Melanie Crazier, MD  Magnesium 400 MG CAPS Take 400 mg by mouth daily.    [provider]  Melatonin 5 MG CAPS Take 5 mg by mouth at bedtime as needed (sleep).    [provider]  METAMUCIL FIBER PO Take 6 capsules by mouth daily.    [provider]  metroNIDAZOLE (METROGEL) 1 % gel Apply topically. 05/29/22   [provider]  Multiple Vitamin (MULTIVITAMIN ADULT PO) Take 1 tablet by mouth daily.    [provider]  olmesartan (BENICAR) 40 MG tablet TAKE ONE TABLET BY MOUTH ONCE DAILY 10/30/22   Hoyt Koch, MD  omega-3 acid ethyl esters (LOVAZA) 1 g capsule TAKE TWO CAPSULES BY MOUTH TWICE DAILY. 12/26/21   Hoyt Koch, MD  ondansetron (ZOFRAN) 4 MG tablet Take 1 tablet (4 mg total) by mouth every 8 (eight) hours as needed for nausea or vomiting. 12/08/21   Hoyt Koch, MD  pravastatin (PRAVACHOL) 40 MG tablet TAKE ONE TABLET ONCE DAILY 02/13/22   Buford Dresser, MD  Pyridoxine HCl (B-6) 100 MG TABS Take 100 mg by mouth daily.    [provider]  rOPINIRole (REQUIP) 1 MG tablet Take 1 tablet (1 mg total) by mouth at bedtime. 06/30/22    Hoyt Koch, MD  sucralfate (CARAFATE) 1 g tablet Take 1 tablet by mouth two times a day. 07/03/22   Thornton Park, MD  TIADYLT ER 240 MG 24 hr capsule Take 1 capsule (240 mg total) by mouth daily. 07/24/22   Buford Dresser, MD  triamcinolone (KENALOG) 0.1 % Apply 1 application topically daily.    [provider]  UNABLE TO FIND Med Name: [place]    [provider]    Allergies:   Allergies  Allergen Reactions   Sulfa Antibiotics Anaphylaxis   Sulfites Anaphylaxis    Itching, diarrhea, headaches    Enalapril Maleate     Unknown reaction   Other     Tape BUT tolerates paper tape   Quinapril     Unknown reaction   Rosuvastatin     Unknown reaction   Ampicillin Itching and Rash    Social History:  reports that she has never smoked. She has never used smokeless tobacco. She reports that she does not drink alcohol. No history on  file for drug use.  Family History: Family History  Problem Relation Age of Onset   Colon cancer Maternal Grandfather    Colon polyps Neg Hx     Physical Exam: Vitals:   11/13/2022 1610 11/20/2022 2007  BP: (!) 145/96 (!) 116/96  Pulse: 75 77  Resp: 20 11  Temp: 98.6 F (37 C) 99.3 F (37.4 C)  TempSrc: Oral Oral  SpO2: 95% 92%    General:  Alert and oriented times three, well developed and nourished, no acute distress, ill appearing lethargic female, mildly clammy, weak Eyes: PERRLA, pink conjunctiva, no scleral icterus ENT: dry oral mucosa, neck supple, no thyromegaly Lungs: clear to ascultation, no wheeze, no crackles, no use of accessory muscles Cardiovascular: regular rate and rhythm, no regurgitation, no gallops, no murmurs. No carotid bruits, no JVD Abdomen: soft, positive BS, non-tender, non-distended, no organomegaly, not an acute abdomen GU: not examined Neuro: CN II - XII grossly intact, sensation intact Musculoskeletal: strength 5/5 all extremities, no clubbing, cyanosis or  edema Skin: no rash, no subcutaneous crepitation, no decubitus Psych: appropriate patient  Labs on Admission:  Recent Labs    11/17/2022 1609  NA 119*  K 2.9*  CL 85*  CO2 20*  GLUCOSE 225*  BUN 24*  CREATININE 1.08*  CALCIUM 7.8*   Recent Labs    10/27/2022 1609  AST 38  ALT 35  ALKPHOS 60  BILITOT 0.8  PROT 5.7*  ALBUMIN 2.9*    Recent Labs    11/20/2022 1609  WBC 12.1*  NEUTROABS 9.4*  HGB 11.4*  HCT 32.3*  MCV 81.6  PLT 277    Micro Results: Recent Results (from the past 240 hour(s))  Resp panel by RT-PCR (RSV, Flu A&B, Covid) Anterior Nasal Swab     Status: None   Collection Time: 11/13/2022  4:17 PM   Specimen: Anterior Nasal Swab  Result Value Ref Range Status   SARS Coronavirus 2 by RT PCR NEGATIVE NEGATIVE Final   Influenza A by PCR NEGATIVE NEGATIVE Final   Influenza B by PCR NEGATIVE NEGATIVE Final    Comment: (NOTE) The Xpert Xpress SARS-CoV-2/FLU/RSV plus assay is intended as an aid in the diagnosis of influenza from Nasopharyngeal swab specimens and should not be used as a sole basis for treatment. Nasal washings and aspirates are unacceptable for Xpert Xpress SARS-CoV-2/FLU/RSV testing.  Fact Sheet for Patients: EntrepreneurPulse.com.au  Fact Sheet for Healthcare Providers: IncredibleEmployment.be  This test is not yet approved or cleared by the [place] FDA and has been authorized for detection and/or diagnosis of SARS-CoV-2 by FDA under an Emergency Use Authorization (EUA). This EUA will remain in effect (meaning this test can be used) for the duration of the COVID-19 declaration under Section 564(b)(1) of the Act, 21 U.S.C. section 360bbb-3(b)(1), unless the authorization is terminated or revoked.     Resp Syncytial Virus by PCR NEGATIVE NEGATIVE Final    Comment: (NOTE) Fact Sheet for Patients: EntrepreneurPulse.com.au  Fact Sheet for Healthcare  Providers: IncredibleEmployment.be  This test is not yet approved or cleared by the [place] FDA and has been authorized for detection and/or diagnosis of SARS-CoV-2 by FDA under an Emergency Use Authorization (EUA). This EUA will remain in effect (meaning this test can be used) for the duration of the COVID-19 declaration under Section 564(b)(1) of the Act, 21 U.S.C. section 360bbb-3(b)(1), unless the authorization is terminated or revoked.  Performed at [facility], [place] [address], [place], [place] [address]  Radiological Exams on Admission: CT Head Wo Contrast  Result Date: 11/26/2022 CLINICAL DATA:  Trauma, fall EXAM: CT HEAD WITHOUT CONTRAST TECHNIQUE: Contiguous axial images were obtained from the base of the skull through the vertex without intravenous contrast. RADIATION DOSE REDUCTION: This exam was performed according to the departmental dose-optimization program which includes automated exposure control, adjustment of the mA and/or kV according to patient size and/or use of iterative reconstruction technique. COMPARISON:  None Available. FINDINGS: Brain: No acute intracranial findings are seen. There are no signs of bleeding within the cranium. Cortical sulci are prominent. Vascular: Unremarkable. Skull: No fracture is seen in calvarium. Sinuses/Orbits: There is mucosal thickening in ethmoid sinus. Other: None. IMPRESSION: No acute intracranial findings are seen in noncontrast CT brain. Atrophy. Mild chronic sinusitis. Electronically Signed   By: Elmer Picker M.D.   On: 10/27/2022 19:07   CT Angio Chest PE W and/or Wo Contrast  Result Date: 11/10/2022 CLINICAL DATA:  Lung consolidation EXAM: CT ANGIOGRAPHY CHEST WITH CONTRAST TECHNIQUE: Multidetector CT imaging of the chest was performed using the standard protocol during bolus administration of intravenous contrast. Multiplanar CT image reconstructions and MIPs were obtained to evaluate the  vascular anatomy. RADIATION DOSE REDUCTION: This exam was performed according to the departmental dose-optimization program which includes automated exposure control, adjustment of the mA and/or kV according to patient size and/or use of iterative reconstruction technique. CONTRAST:  28m OMNIPAQUE IOHEXOL 350 MG/ML SOLN COMPARISON:  Chest radiograph done earlier today FINDINGS: Cardiovascular: Scattered calcifications are seen in thoracic aorta and its major branches. There is possible significant stenosis in proximal left subclavian. Coronary artery calcifications are seen. Dense calcification is seen in mitral annulus. Mediastinum/Nodes: No significant lymphadenopathy seen in mediastinum. There are slightly enlarged lymph nodes in both hilar regions, more so on the right side. Lungs/Pleura: There is large alveolar infiltrate in posterior segment of right upper lobe. There are patchy infiltrates in anterior and apical segments of right upper lobe, right middle lobe, lingula and right lower lobe. There is small right pleural effusion. There is no pneumothorax. Upper Abdomen: There is nodularity in liver surface. Small hiatal hernia is seen. Musculoskeletal: Degenerative changes are noted in lower cervical spine. Review of the MIP images confirms the above findings. IMPRESSION: There is large alveolar infiltrate involving most of the posterior segment of right upper lobe suggesting pneumonia. There are other small patchy infiltrates in the rest of the right upper lobe, lingula and right lower lobe suggesting multifocal pneumonia. Follow-up studies after antibiotic treatment should be considered to rule out any underlying obstructing neoplastic process. Small right pleural effusion.  There is no pneumothorax. Coronary artery disease. Thoracic aortic atherosclerosis. Possible significant stenosis in proximal left subclavian artery. Cirrhosis.  Small hiatal hernia. Electronically Signed   By: PElmer PickerM.D.    On: 10/31/2022 19:03   DG Chest 2 View  Result Date: 11/05/2022 CLINICAL DATA:  Weakness EXAM: CHEST - 2 VIEW COMPARISON:  None Available. FINDINGS: No pleural effusion. No pneumothorax. Normal cardiac and mediastinal contours. There is a large consolidative opacity in the right mid lung, which could represent infection, further evaluation with a CT of the chest is recommended exclude the possibility of an underlying malignancy. No radiographically apparent displaced rib fractures. Visualized upper abdomen is unremarkable. Vertebral body heights are maintained. Aortic atherosclerotic calcifications. IMPRESSION: Large consolidative opacity in the right mid lung could represent infection, but further evaluation with a CT of the chest is recommended to exclude the possibility of an underlying  malignancy. Electronically Signed   By: Marin Roberts M.D.   On: 11/17/2022 16:49    Assessment/Plan Present on Admission:  CAP (community acquired pneumonia)  Acute respiratory failure w/ hypoxia -on 2L oxygen sating 92% -PNA ordereset initiated -Blood cultures x 2, IV antibiotics Rocephin and azithromycin ordered -Per CT chest: Concern for underlying malignancy.  Follow-up imaging recommended especially in light of patient's hyponatremia -Patient denies weight loss.  She has no h/o smoking.  She has a good apetite   Hyponatremia -DDx: Infection, hypovolemic hyponatremia, diuretic, malignancy, SIADH, hypothyroidism -TSH, Urine Na, Urine and plasma osmolality ordered -On chlorthalidone, held. On Welbutrin => SIADH? -Urine osmolality 678, urine sodium 38.  IVF hydration.  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

## 2022-11-03 NOTE — ED Triage Notes (Signed)
Pt BIB [facility] from home d/t multiple falls since Sunday secondary to weakness and dizziness. Dizziness worse w standing the sitting. Pt coughs w brown sputums wo fevers. No injuries from the fall. Pt is diabatic. A&O X4.

## 2022-11-03 NOTE — ED Provider Notes (Signed)
[facility] Provider Note   CSN: [id number] Arrival date & time: 11/05/2022  1601     History  Chief Complaint  Patient presents with   Weakness   Dizziness    Sarah Key is a 77 y.o. female.  This is a 77 year old female with history of type 2 diabetes on insulin, hypertension, hyperlipidemia, and migraine presenting to the ED for weakness.  Patient states 5 days ago she experienced bilateral rib pain worse with inspiration and cough.  She also developed a cough which has become productive of greenish sputum.  Denies any fevers, endorses fatigue and lightheadedness when she ambulates.  She has had a couple falls but denies hitting her head.  She is not on any anticoagulation, no known sick contacts.  Denies any chest pain or shortness of breath.     Home Medications Prior to Admission medications   Medication Sig Start Date End Date Taking? Authorizing Provider  aspirin EC 81 MG tablet Take 81 mg by mouth daily. Swallow whole.    [provider]  atenolol (TENORMIN) 100 MG tablet TAKE ONE TABLET BY MOUTH DAILY 10/30/22   Hoyt Koch, MD  blood glucose meter kit and supplies by Other route as directed. Dispense based on patient and insurance preference. Use up to four times daily as directed. (FOR ICD-10 E10.9, E11.9).    [provider]  budesonide (ENTOCORT EC) 3 MG 24 hr capsule Take 1 capsule (3 mg total) by mouth daily. 09/15/22   Thornton Park, MD  buPROPion (WELLBUTRIN XL) 150 MG 24 hr tablet Take 2 tablets (300 mg total) by mouth every morning. 03/08/22   Hoyt Koch, MD  butalbital-aspirin-caffeine [facility]) [phone] MG capsule TAKE ONE CAPSULE EVERY 6 HOURS AS NEEDED FOR MIGRAINE 07/10/22   Hoyt Koch, MD  chlorthalidone (HYGROTON) 25 MG tablet TAKE ONE TABLET BY MOUTH DAILY 02/27/22   Buford Dresser, MD  Continuous Blood Gluc Sensor (DEXCOM G7 SENSOR) MISC 1 Device  by Does not apply route as directed. 07/27/22   Shamleffer, Melanie Crazier, MD  Continuous Blood Gluc Sensor (FREESTYLE LIBRE 14 DAY SENSOR) MISC Use as instructed, change every 14 days 08/17/22   Shamleffer, Melanie Crazier, MD  Cyanocobalamin (B-12) 2500 MCG TABS Take 2,500 mcg by mouth daily.    [provider]  diltiazem (CARDIZEM CD) 240 MG 24 hr capsule Take 240 mg by mouth daily.    [provider]  doxycycline (PERIOSTAT) 20 MG tablet Take 40 mg by mouth daily as needed (rosacea flare). Patient not taking: Reported on 11/16/2022 08/17/14   [provider]  ezetimibe (ZETIA) 10 MG tablet TAKE ONE TABLET BY MOUTH ONCE DAILY 07/24/22   Hoyt Koch, MD  famotidine (PEPCID) 40 MG tablet TAKE ONE TABLET BY MOUTH IN THE MORNING 02/27/22   Buford Dresser, MD  insulin aspart (NOVOLOG FLEXPEN) 100 UNIT/ML FlexPen Max 50 units daily Novolog insulin to carb ratio 1 to 8 with each meal 07/28/22   Shamleffer, Melanie Crazier, MD  insulin glargine (LANTUS SOLOSTAR) 100 UNIT/ML Solostar Pen Inject 30 Units into the skin daily. 07/27/22   Shamleffer, Melanie Crazier, MD  Insulin Pen Needle 29G X 5MM MISC 1 Device by Does not apply route in the morning, at noon, in the evening, and at bedtime. 07/27/22   Shamleffer, Melanie Crazier, MD  Magnesium 400 MG CAPS Take 400 mg by mouth daily.    [provider]  Melatonin 5 MG CAPS  Take 5 mg by mouth at bedtime as needed (sleep).    [provider]  METAMUCIL FIBER PO Take 6 capsules by mouth daily.    [provider]  metroNIDAZOLE (METROGEL) 1 % gel Apply topically. 05/29/22   [provider]  Multiple Vitamin (MULTIVITAMIN ADULT PO) Take 1 tablet by mouth daily.    [provider]  olmesartan (BENICAR) 40 MG tablet TAKE ONE TABLET BY MOUTH ONCE DAILY 10/30/22   Hoyt Koch, MD  omega-3 acid ethyl esters (LOVAZA) 1 g capsule TAKE TWO CAPSULES BY MOUTH TWICE DAILY. 12/26/21    Hoyt Koch, MD  ondansetron (ZOFRAN) 4 MG tablet Take 1 tablet (4 mg total) by mouth every 8 (eight) hours as needed for nausea or vomiting. 12/08/21   Hoyt Koch, MD  pravastatin (PRAVACHOL) 40 MG tablet TAKE ONE TABLET ONCE DAILY 02/13/22   Buford Dresser, MD  Pyridoxine HCl (B-6) 100 MG TABS Take 100 mg by mouth daily.    [provider]  rOPINIRole (REQUIP) 1 MG tablet Take 1 tablet (1 mg total) by mouth at bedtime. 06/30/22   Hoyt Koch, MD  sucralfate (CARAFATE) 1 g tablet Take 1 tablet by mouth two times a day. 07/03/22   Thornton Park, MD  TIADYLT ER 240 MG 24 hr capsule Take 1 capsule (240 mg total) by mouth daily. 07/24/22   Buford Dresser, MD  triamcinolone (KENALOG) 0.1 % Apply 1 application topically daily.    [provider]  UNABLE TO FIND Med Name: [facility] Kit    [provider]      Allergies    Sulfa antibiotics, Sulfites, Enalapril maleate, Other, Quinapril, Rosuvastatin, and Ampicillin    Review of Systems   Review of Systems  Constitutional:  Positive for fatigue. Negative for chills and fever.  Respiratory:  Positive for cough. Negative for shortness of breath.   Cardiovascular:  Negative for chest pain.  Gastrointestinal:  Negative for abdominal pain, diarrhea, nausea and vomiting.  Genitourinary:  Negative for dysuria.  Neurological:  Positive for weakness. Negative for syncope and speech difficulty.    Physical Exam Updated Vital Signs There were no vitals taken for this visit. Physical Exam Vitals reviewed.  Constitutional:      Appearance: Normal appearance.  HENT:     Head: Normocephalic.     Nose: Nose normal.     Mouth/Throat:     Mouth: Mucous membranes are moist.  Eyes:     Extraocular Movements: Extraocular movements intact.     Pupils: Pupils are equal, round, and reactive to light.  Cardiovascular:     Rate and Rhythm: Normal rate and regular rhythm.      Pulses: Normal pulses.     Heart sounds: Normal heart sounds. No murmur heard.    No friction rub. No gallop.  Pulmonary:     Effort: Pulmonary effort is normal. No respiratory distress.     Breath sounds: No wheezing or rales.  Chest:     Chest wall: No tenderness.  Abdominal:     General: There is no distension.     Palpations: Abdomen is soft.     Tenderness: There is no abdominal tenderness. There is no guarding or rebound.  Musculoskeletal:        General: Normal range of motion.     Cervical back: Normal range of motion. No rigidity.  Skin:    General: Skin is warm.     Capillary Refill: Capillary refill takes  less than 2 seconds.  Neurological:     General: No focal deficit present.     Mental Status: She is alert and oriented to person, place, and time.     Cranial Nerves: No cranial nerve deficit.     Sensory: No sensory deficit.     ED Results / Procedures / Treatments   Labs (all labs ordered are listed, but only abnormal results are displayed) Labs Reviewed  RESP PANEL BY RT-PCR (RSV, FLU A&B, COVID)  [id number]  CBC WITH DIFFERENTIAL/PLATELET  COMPREHENSIVE METABOLIC PANEL  URINALYSIS, ROUTINE W REFLEX MICROSCOPIC  TROPONIN I (HIGH SENSITIVITY)    EKG None  Radiology No results found.  Procedures Procedures    Medications Ordered in ED Medications  lactated ringers bolus 1,000 mL (has no administration in time range)    ED Course/ Medical Decision Making/ A&P                             Medical Decision Making Patient presents with weakness last 5 days, her clinical picture is complicated by diabetes and hypertension.  On exam she has no focal neurologic deficits, cranial nerves II through XII intact, finger-nose and heel-to-shin normal, no visual field deficits, I have low suspicion for acute ischemic stroke, will obtain a CT head to evaluate for ICH. Her lungs are clear to auscultation bilaterally, she is afebrile, has no focal findings on exam  suggestive of pneumonia, screening chest x-ray ordered since patient is having some rib pain.  Cannot rule out ACS at this time, troponin and EKG sent.  Screening labs including CBC and CMP ordered, urinalysis ordered.  Given 1 L LR for volume resuscitation.  I personally reviewed and interpreted patient's chest x-ray, agree with radiology, there is a large consolidative opacity in the right midlung which could be infection, however cannot rule out possibility of underlying malignancy.  Recommend CT chest.  CT PE study ordered.  I personally reviewed and interpreted patient's labs, she has hyponatremia of 119 with hypokalemia of 2.9, hypochloremia of 85, slightly elevated creatinine of 1.08 from her baseline of 0.9, decreased calcium to 7.8 with hypoalbuminemia of 2.9.  Initial troponin 15, leukocytosis of 12.1.  Patient's CT chest shows likely right upper lobe pneumonia and possible right lower lobe pneumonia suggesting multifocal.  There is still concern for possible underlying obstructive neoplastic process and should be reevaluated after antibiotic treatment.  CT head showed no acute intracranial hemorrhage or large territory infarction.    I personally reviewed and interpreted patient's EKG shows normal sinus rhythm with rate 68, PR, QRS, QTc normal, she has slight ST depression in V4 V5.   With the patient's electrolyte abnormalities as well as multifocal pneumonia, plan to admit to hospital.  2 g IV Rocephin ordered as well as azithromycin, blood cultures ordered.  I called and discussed this case with the hospitalist team who reviewed to admit patient to their service for further workup and management.  Patient was stable upon admission to hospital.  Problems Addressed: Pneumonia of right lung due to infectious organism, unspecified part of lung: acute illness or injury that poses a threat to life or bodily functions  Amount and/or Complexity of Data Reviewed Labs: ordered. Decision-making  details documented in ED Course. Radiology: ordered and independent interpretation performed. Decision-making details documented in ED Course. ECG/medicine tests: ordered and independent interpretation performed. Decision-making details documented in ED Course.  Risk Prescription drug management. Decision regarding hospitalization.  Final Clinical Impression(s) / ED Diagnoses Final diagnoses:  None    Rx / DC Orders ED Discharge Orders     None         Jimmie Molly, MD 11/25/2022 Judith Blonder    Isla Pence, MD 11/02/2022 2001

## 2022-11-04 ENCOUNTER — Encounter (HOSPITAL_COMMUNITY): Payer: Self-pay | Admitting: Family Medicine

## 2022-11-04 DIAGNOSIS — J189 Pneumonia, unspecified organism: Secondary | ICD-10-CM | POA: Diagnosis not present

## 2022-11-04 LAB — CBC WITH DIFFERENTIAL/PLATELET
Abs Immature Granulocytes: 0.09 10*3/uL — ABNORMAL HIGH (ref 0.00–0.07)
Basophils Absolute: 0 10*3/uL (ref 0.0–0.1)
Basophils Relative: 0 %
Eosinophils Absolute: 0 10*3/uL (ref 0.0–0.5)
Eosinophils Relative: 0 %
HCT: 32.1 % — ABNORMAL LOW (ref 36.0–46.0)
Hemoglobin: 11.2 g/dL — ABNORMAL LOW (ref 12.0–15.0)
Immature Granulocytes: 1 %
Lymphocytes Relative: 6 %
Lymphs Abs: 0.6 10*3/uL — ABNORMAL LOW (ref 0.7–4.0)
MCH: 28.1 pg (ref 26.0–34.0)
MCHC: 34.9 g/dL (ref 30.0–36.0)
MCV: 80.7 fL (ref 80.0–100.0)
Monocytes Absolute: 0.8 10*3/uL (ref 0.1–1.0)
Monocytes Relative: 8 %
Neutro Abs: 9.4 10*3/uL — ABNORMAL HIGH (ref 1.7–7.7)
Neutrophils Relative %: 85 %
Platelets: 257 10*3/uL (ref 150–400)
RBC: 3.98 MIL/uL (ref 3.87–5.11)
RDW: 13.2 % (ref 11.5–15.5)
WBC: 11 10*3/uL — ABNORMAL HIGH (ref 4.0–10.5)
nRBC: 0 % (ref 0.0–0.2)

## 2022-11-04 LAB — BASIC METABOLIC PANEL
Anion gap: 10 (ref 5–15)
Anion gap: 11 (ref 5–15)
BUN: 17 mg/dL (ref 8–23)
BUN: 16 mg/dL (ref 8–23)
CO2: 19 mmol/L — ABNORMAL LOW (ref 22–32)
CO2: 16 mmol/L — ABNORMAL LOW (ref 22–32)
Calcium: 7.4 mg/dL — ABNORMAL LOW (ref 8.9–10.3)
Calcium: 7.5 mg/dL — ABNORMAL LOW (ref 8.9–10.3)
Chloride: 93 mmol/L — ABNORMAL LOW (ref 98–111)
Chloride: 96 mmol/L — ABNORMAL LOW (ref 98–111)
Creatinine, Ser: 0.76 mg/dL (ref 0.44–1.00)
Creatinine, Ser: 0.82 mg/dL (ref 0.44–1.00)
GFR, Estimated: >60 mL/min (ref >60)
GFR, Estimated: >60 mL/min (ref >60)
Glucose, Bld: 240 mg/dL — ABNORMAL HIGH (ref 70–99)
Glucose, Bld: 272 mg/dL — ABNORMAL HIGH (ref 70–99)
Potassium: 3.6 mmol/L (ref 3.5–5.1)
Potassium: 4 mmol/L (ref 3.5–5.1)
Sodium: 122 mmol/L — ABNORMAL LOW (ref 135–145)
Sodium: 123 mmol/L — ABNORMAL LOW (ref 135–145)

## 2022-11-04 LAB — CBC
HCT: 33.5 % — ABNORMAL LOW (ref 36.0–46.0)
Hemoglobin: 11.1 g/dL — ABNORMAL LOW (ref 12.0–15.0)
MCH: 28.1 pg (ref 26.0–34.0)
MCHC: 33.1 g/dL (ref 30.0–36.0)
MCV: 84.8 fL (ref 80.0–100.0)
Platelets: 235 10*3/uL (ref 150–400)
RBC: 3.95 MIL/uL (ref 3.87–5.11)
RDW: 13 % (ref 11.5–15.5)
WBC: 10.4 10*3/uL (ref 4.0–10.5)
nRBC: 0 % (ref 0.0–0.2)

## 2022-11-04 LAB — CREATININE, SERUM
Creatinine, Ser: 0.89 mg/dL (ref 0.44–1.00)
GFR, Estimated: >60 mL/min (ref >60)

## 2022-11-04 LAB — URINALYSIS, ROUTINE W REFLEX MICROSCOPIC
Bilirubin Urine: NEGATIVE
Glucose, UA: 50 mg/dL — AB
Hgb urine dipstick: NEGATIVE
Ketones, ur: 20 mg/dL — AB
Leukocytes,Ua: NEGATIVE
Nitrite: NEGATIVE
Protein, ur: NEGATIVE mg/dL
Specific Gravity, Urine: 1.03 (ref 1.005–1.030)
pH: 6 (ref 5.0–8.0)

## 2022-11-04 LAB — GLUCOSE, CAPILLARY: Glucose-Capillary: 206 mg/dL — ABNORMAL HIGH (ref 70–99)

## 2022-11-04 LAB — STREP PNEUMONIAE URINARY ANTIGEN: Strep Pneumo Urinary Antigen: NEGATIVE

## 2022-11-04 LAB — CBG MONITORING, ED: Glucose-Capillary: 296 mg/dL — ABNORMAL HIGH (ref 70–99)

## 2022-11-04 LAB — SODIUM, URINE, RANDOM: Sodium, Ur: 38 mmol/L

## 2022-11-04 LAB — OSMOLALITY, URINE: Osmolality, Ur: 678 mosm/kg (ref 300–900)

## 2022-11-04 MED ORDER — ALBUTEROL SULFATE (2.5 MG/3ML) 0.083% IN NEBU
INHALATION_SOLUTION | RESPIRATORY_TRACT | Status: AC
  Administered 2022-11-04: 2.5 mg
  Filled 2022-11-04: qty 3

## 2022-11-04 MED ORDER — BUPROPION HCL ER (XL) 150 MG PO TB24
300.0000 mg | ORAL_TABLET | Freq: Every day | ORAL | Status: DC
  Administered 2022-11-04 – 2022-11-07 (×4): 300 mg via ORAL
  Filled 2022-11-04 (×3): qty 2
  Filled 2022-11-04 (×2): qty 1
  Filled 2022-11-04: qty 2

## 2022-11-04 MED ORDER — IRBESARTAN 300 MG PO TABS
150.0000 mg | ORAL_TABLET | Freq: Every day | ORAL | Status: DC
  Administered 2022-11-05 – 2022-11-07 (×3): 150 mg via ORAL
  Filled 2022-11-04 (×4): qty 1

## 2022-11-04 NOTE — ED Notes (Signed)
Patients monitor reads CBG [id number]

## 2022-11-04 NOTE — ED Notes (Signed)
ED TO INPATIENT HANDOFF REPORT  ED Nurse Name and Phone #: Vikki Ports [phone]  S Name/Age/Gender Sarah Key 77 y.o. female Room/Bed: 044C/044C  Code Status   Code Status: Full Code  Home/SNF/Other Home Patient oriented to: self, place, time, and situation Is this baseline? Yes   Triage Complete: Triage complete  Chief Complaint Hyponatremia [E87.1]  Triage Note Pt BIB [facility] from home d/t multiple falls since Sunday secondary to weakness and dizziness. Dizziness worse w standing the sitting. Pt coughs w brown sputums wo fevers. No injuries from the fall. Pt is diabatic. A&O X4.    Allergies Allergies  Allergen Reactions   Sulfa Antibiotics Anaphylaxis   Sulfites Anaphylaxis    Itching, diarrhea, headaches    Enalapril Maleate     Unknown reaction   Other     Tape BUT tolerates paper tape   Quinapril     Unknown reaction   Rosuvastatin     Unknown reaction   Ampicillin Itching and Rash    Level of Care/Admitting Diagnosis ED Disposition     ED Disposition  Admit   Condition  --   [place]: [place] [100100]  Level of Care: Progressive [102]  Admit to Progressive based on following criteria: RESPIRATORY PROBLEMS hypoxemic/hypercapnic respiratory failure that is responsive to NIPPV (BiPAP) or High Flow Nasal Cannula (6-80 lpm). Frequent assessment/intervention, no > Q2 hrs < Q4 hrs, to maintain oxygenation and pulmonary hygiene.  May admit patient to [facility] or [facility] if equivalent level of care is available:: Yes  Covid Evaluation: Confirmed COVID Negative  Diagnosis: Hyponatremia [id number]  Admitting Physician: [place], [place]  Attending Physician: Quintella Baton [id number]  Certification:: I certify this patient will need inpatient services for at least 2 midnights  Estimated Length of Stay: 2          B Medical/Surgery History Past Medical History:  Diagnosis Date   Arthritis    Depression     History of kidney stones    passed   Hyperlipidemia associated with type 2 diabetes mellitus ([place])    Hypertension    Osteoporosis    PVC's (premature ventricular contractions)    Type II diabetes mellitus ([place])    Past Surgical History:  Procedure Laterality Date   CATARACT EXTRACTION Right 02/2021   CATARACT EXTRACTION Left 03/2021   COLONOSCOPY WITH ESOPHAGOGASTRODUODENOSCOPY (EGD)     ORIF ELBOW FRACTURE Right 06/21/2021   Procedure: OPEN REDUCTION INTERNAL FIXATION (ORIF) ELBOW/OLECRANON FRACTURE;  Surgeon: Roseanne Kaufman, MD;  Location: [facility];  Service: Orthopedics;  Laterality: Right;   ULNAR NERVE TRANSPOSITION Right 06/21/2021   Procedure: ULNAR NERVE DECOMPRESSION and BURSECTOMY;  Surgeon: Roseanne Kaufman, MD;  Location: [facility];  Service: Orthopedics;  Laterality: Right;     A IV Location/Drains/Wounds Patient Lines/Drains/Airways Status     Active Line/Drains/Airways     Name Placement date Placement time Site Days   Peripheral IV 11/07/2022 20 G Right Antecubital 11/22/2022  --  Antecubital  1   Peripheral IV 10/31/2022 18 G Left Antecubital 10/28/2022  2022  Antecubital  1   Incision (Closed) 06/21/21 Elbow Right 06/21/21  1657  -- 501            Intake/Output Last 24 hours  Intake/Output Summary (Last 24 hours) at 11/04/2022 1214 Last data filed at 11/13/2022 2147 Gross per 24 hour  Intake 2349.59 ml  Output --  Net 2349.59 ml    Labs/Imaging Results for orders placed or  performed during the hospital encounter of 11/24/2022 (from the past 48 hour(s))  CBC with Differential     Status: Abnormal   Collection Time: 11/06/2022  4:09 PM  Result Value Ref Range   WBC 12.1 (H) 4.0 - 10.5 K/uL   RBC 3.96 3.87 - 5.11 MIL/uL   Hemoglobin 11.4 (L) 12.0 - 15.0 g/dL   HCT 32.3 (L) 36.0 - 46.0 %   MCV 81.6 80.0 - 100.0 fL   MCH 28.8 26.0 - 34.0 pg   MCHC 35.3 30.0 - 36.0 g/dL   RDW 12.9 11.5 - 15.5 %   Platelets 277 150 - 400 K/uL   nRBC 0.0 0.0 - 0.2 %   Neutrophils  Relative % 78 %   Neutro Abs 9.4 (H) 1.7 - 7.7 K/uL   Lymphocytes Relative 8 %   Lymphs Abs 0.9 0.7 - 4.0 K/uL   Monocytes Relative 13 %   Monocytes Absolute 1.6 (H) 0.1 - 1.0 K/uL   Eosinophils Relative 0 %   Eosinophils Absolute 0.0 0.0 - 0.5 K/uL   Basophils Relative 0 %   Basophils Absolute 0.0 0.0 - 0.1 K/uL   Immature Granulocytes 1 %   Abs Immature Granulocytes 0.10 (H) 0.00 - 0.07 K/uL    Comment: Performed at [facility], [address], [place], [place] [address]  Comprehensive metabolic panel     Status: Abnormal   Collection Time: 11/14/2022  4:09 PM  Result Value Ref Range   Sodium 119 (LL) 135 - 145 mmol/L    Comment: CRITICAL RESULT CALLED TO, READ BACK BY AND VERIFIED WITH C,CHRISCOE RN '@[id number]'$  11/13/2022 E,[place]   Potassium 2.9 (L) 3.5 - 5.1 mmol/L   Chloride 85 (L) 98 - 111 mmol/L   CO2 20 (L) 22 - 32 mmol/L   Glucose, Bld 225 (H) 70 - 99 mg/dL    Comment: Glucose reference range applies only to samples taken after fasting for at least 8 hours.   BUN 24 (H) 8 - 23 mg/dL   Creatinine, Ser 1.08 (H) 0.44 - 1.00 mg/dL   Calcium 7.8 (L) 8.9 - 10.3 mg/dL   Total Protein 5.7 (L) 6.5 - 8.1 g/dL   Albumin 2.9 (L) 3.5 - 5.0 g/dL   AST 38 15 - 41 U/L   ALT 35 0 - 44 U/L   Alkaline Phosphatase 60 38 - 126 U/L   Total Bilirubin 0.8 0.3 - 1.2 mg/dL   GFR, Estimated 53 (L) >60 mL/min    Comment: (NOTE) Calculated using the CKD-EPI Creatinine Equation (2021)    Anion gap 14 5 - 15    Comment: Performed at [facility], [place] [address] [place], [place] [address]  Troponin I (High Sensitivity)     Status: None   Collection Time: 11/17/2022  4:09 PM  Result Value Ref Range   Troponin I (High Sensitivity) 15 <18 ng/L    Comment: (NOTE) Elevated high sensitivity troponin I (hsTnI) values and significant  changes across serial measurements may suggest ACS but many other  chronic and acute conditions are known to elevate hsTnI results.  Refer to the "Links" section for  chest pain algorithms and additional  guidance. Performed at [facility], [place] [address], [place], [place] [address]   Resp panel by RT-PCR (RSV, Flu A&B, Covid) Anterior Nasal Swab     Status: None   Collection Time: 11/19/2022  4:17 PM   Specimen: Anterior Nasal Swab  Result Value Ref Range  SARS Coronavirus 2 by RT PCR NEGATIVE NEGATIVE   Influenza A by PCR NEGATIVE NEGATIVE   Influenza B by PCR NEGATIVE NEGATIVE    Comment: (NOTE) The Xpert Xpress SARS-CoV-2/FLU/RSV plus assay is intended as an aid in the diagnosis of influenza from Nasopharyngeal swab specimens and should not be used as a sole basis for treatment. Nasal washings and aspirates are unacceptable for Xpert Xpress SARS-CoV-2/FLU/RSV testing.  Fact Sheet for Patients: EntrepreneurPulse.com.au  Fact Sheet for Healthcare Providers: IncredibleEmployment.be  This test is not yet approved or cleared by the [place] FDA and has been authorized for detection and/or diagnosis of SARS-CoV-2 by FDA under an Emergency Use Authorization (EUA). This EUA will remain in effect (meaning this test can be used) for the duration of the COVID-19 declaration under Section 564(b)(1) of the Act, 21 U.S.C. section 360bbb-3(b)(1), unless the authorization is terminated or revoked.     Resp Syncytial Virus by PCR NEGATIVE NEGATIVE    Comment: (NOTE) Fact Sheet for Patients: EntrepreneurPulse.com.au  Fact Sheet for Healthcare Providers: IncredibleEmployment.be  This test is not yet approved or cleared by the [place] FDA and has been authorized for detection and/or diagnosis of SARS-CoV-2 by FDA under an Emergency Use Authorization (EUA). This EUA will remain in effect (meaning this test can be used) for the duration of the COVID-19 declaration under Section 564(b)(1) of the Act, 21 U.S.C. section 360bbb-3(b)(1), unless the authorization is  terminated or revoked.  Performed at [facility], [place] [address] [place], [place] [address]   Troponin I (High Sensitivity)     Status: Abnormal   Collection Time: 11/01/2022  6:10 PM  Result Value Ref Range   Troponin I (High Sensitivity) 19 (H) <18 ng/L    Comment: (NOTE) Elevated high sensitivity troponin I (hsTnI) values and significant  changes across serial measurements may suggest ACS but many other  chronic and acute conditions are known to elevate hsTnI results.  Refer to the "Links" section for chest pain algorithms and additional  guidance. Performed at [facility], [place] [address] [place], [place] [address]   Blood culture (routine x 2)     Status: None (Preliminary result)   Collection Time: 10/27/2022  8:13 PM   Specimen: BLOOD LEFT HAND  Result Value Ref Range   Specimen Description BLOOD LEFT HAND    Special Requests      BOTTLES DRAWN AEROBIC AND ANAEROBIC Blood Culture results may not be optimal due to an inadequate volume of blood received in culture bottles   Culture      NO GROWTH < 12 HOURS Performed at [facility] [address], [place], [place] [address]    Report Status PENDING   Blood culture (routine x 2)     Status: None (Preliminary result)   Collection Time: 11/15/2022  8:19 PM   Specimen: BLOOD RIGHT HAND  Result Value Ref Range   Specimen Description BLOOD RIGHT HAND    Special Requests      BOTTLES DRAWN AEROBIC AND ANAEROBIC Blood Culture results may not be optimal due to an inadequate volume of blood received in culture bottles   Culture      NO GROWTH < 12 HOURS Performed at [facility] [address] [place], [place] [address]    Report Status PENDING   Magnesium     Status: None   Collection Time: 11/13/2022  8:45 PM  Result Value Ref Range   Magnesium 2.0 1.7 -  2.4 mg/dL    Comment: Performed at [facility], [place] [address], [place], [place] [address]  TSH     Status: None   Collection Time:  10/27/2022  8:45 PM  Result Value Ref Range   TSH 0.414 0.350 - 4.500 uIU/mL    Comment: Performed by a 3rd Generation assay with a functional sensitivity of <=0.01 uIU/mL. Performed at [facility], [place] [address] [place], [place] [address]   Osmolality     Status: Abnormal   Collection Time: 11/13/2022  8:45 PM  Result Value Ref Range   Osmolality 261 (L) 275 - 295 mOsm/kg    Comment: Performed at [facility] [address], [place], [place] [id number]  Basic metabolic panel     Status: Abnormal   Collection Time: 11/13/2022  8:45 PM  Result Value Ref Range   Sodium 119 (LL) 135 - 145 mmol/L    Comment: CRITICAL RESULT CALLED TO, READ BACK BY AND VERIFIED WITH T.SHEARIN,RN. 2149 11/02/2022. LPAIT   Potassium 3.0 (L) 3.5 - 5.1 mmol/L   Chloride 85 (L) 98 - 111 mmol/L   CO2 23 22 - 32 mmol/L   Glucose, Bld 204 (H) 70 - 99 mg/dL    Comment: Glucose reference range applies only to samples taken after fasting for at least 8 hours.   BUN 20 8 - 23 mg/dL   Creatinine, Ser 0.90 0.44 - 1.00 mg/dL   Calcium 7.7 (L) 8.9 - 10.3 mg/dL   GFR, Estimated >60 >60 mL/min    Comment: (NOTE) Calculated using the CKD-EPI Creatinine Equation (2021)    Anion gap 11 5 - 15    Comment: Performed at [facility] [address] [place], [place] [address]  CBG monitoring, ED     Status: Abnormal   Collection Time: 11/24/2022  9:09 PM  Result Value Ref Range   Glucose-Capillary 227 (H) 70 - 99 mg/dL    Comment: Glucose reference range applies only to samples taken after fasting for at least 8 hours.  CBG monitoring, ED     Status: Abnormal   Collection Time: 10/29/2022 10:37 PM  Result Value Ref Range   Glucose-Capillary 252 (H) 70 - 99 mg/dL    Comment: Glucose reference range applies only to samples taken after fasting for at least 8 hours.   Comment 1 Notify RN   Urinalysis, Routine w reflex microscopic -Urine, Clean Catch     Status: Abnormal   Collection Time: 10/30/2022 11:39 PM  Result  Value Ref Range   Color, Urine YELLOW YELLOW   APPearance CLEAR CLEAR   Specific Gravity, Urine 1.030 1.005 - 1.030   pH 6.0 5.0 - 8.0   Glucose, UA 50 (A) NEGATIVE mg/dL   Hgb urine dipstick NEGATIVE NEGATIVE   Bilirubin Urine NEGATIVE NEGATIVE   Ketones, ur 20 (A) NEGATIVE mg/dL   Protein, ur NEGATIVE NEGATIVE mg/dL   Nitrite NEGATIVE NEGATIVE   Leukocytes,Ua NEGATIVE NEGATIVE   RBC / HPF 0-5 0 - 5 RBC/hpf   WBC, UA 0-5 0 - 5 WBC/hpf   Bacteria, UA RARE (A) NONE SEEN   Squamous Epithelial / HPF 0-5 0 - 5 /HPF    Comment: Performed at [facility] [address], [place], [place] [address]  Sodium, urine, random     Status: None   Collection Time: 11/24/2022 11:39 PM  Result Value Ref Range   Sodium, Ur 38 mmol/L    Comment: Performed  at [facility], [place] [address], [place], [place] [address]  Osmolality, urine     Status: None   Collection Time: 11/17/2022 11:39 PM  Result Value Ref Range   Osmolality, Ur 678 300 - 900 mOsm/kg    Comment: Performed at [facility] [address] [place], [place] [address]  Strep pneumoniae urinary antigen     Status: None   Collection Time: 11/10/2022 11:39 PM  Result Value Ref Range   Strep Pneumo Urinary Antigen NEGATIVE NEGATIVE    Comment:        Infection due to S. pneumoniae cannot be absolutely ruled out since the antigen present may be below the detection limit of the test. Performed at [facility], [address] [place], [place] [address]   CBC     Status: Abnormal   Collection Time: 11/10/2022 11:57 PM  Result Value Ref Range   WBC 10.4 4.0 - 10.5 K/uL   RBC 3.95 3.87 - 5.11 MIL/uL   Hemoglobin 11.1 (L) 12.0 - 15.0 g/dL   HCT 33.5 (L) 36.0 - 46.0 %   MCV 84.8 80.0 - 100.0 fL   MCH 28.1 26.0 - 34.0 pg   MCHC 33.1 30.0 - 36.0 g/dL   RDW 13.0 11.5 - 15.5 %   Platelets 235 150 - 400 K/uL   nRBC 0.0 0.0 - 0.2 %    Comment: Performed at [facility], [place] [address], [place], [place] [address]   Creatinine, serum     Status: None   Collection Time: 11/07/2022 11:57 PM  Result Value Ref Range   Creatinine, Ser 0.89 0.44 - 1.00 mg/dL   GFR, Estimated >60 >60 mL/min    Comment: (NOTE) Calculated using the CKD-EPI Creatinine Equation (2021) Performed at [facility] [address] [place], [place] [address]   CBC with Differential/Platelet     Status: Abnormal   Collection Time: 11/04/22  2:51 AM  Result Value Ref Range   WBC 11.0 (H) 4.0 - 10.5 K/uL   RBC 3.98 3.87 - 5.11 MIL/uL   Hemoglobin 11.2 (L) 12.0 - 15.0 g/dL   HCT 32.1 (L) 36.0 - 46.0 %   MCV 80.7 80.0 - 100.0 fL   MCH 28.1 26.0 - 34.0 pg   MCHC 34.9 30.0 - 36.0 g/dL   RDW 13.2 11.5 - 15.5 %   Platelets 257 150 - 400 K/uL   nRBC 0.0 0.0 - 0.2 %   Neutrophils Relative % 85 %   Neutro Abs 9.4 (H) 1.7 - 7.7 K/uL   Lymphocytes Relative 6 %   Lymphs Abs 0.6 (L) 0.7 - 4.0 K/uL   Monocytes Relative 8 %   Monocytes Absolute 0.8 0.1 - 1.0 K/uL   Eosinophils Relative 0 %   Eosinophils Absolute 0.0 0.0 - 0.5 K/uL   Basophils Relative 0 %   Basophils Absolute 0.0 0.0 - 0.1 K/uL   Immature Granulocytes 1 %   Abs Immature Granulocytes 0.09 (H) 0.00 - 0.07 K/uL    Comment: Performed at [facility], [address], [place],  [id number]  Basic metabolic panel     Status: Abnormal   Collection Time: 11/04/22  2:51 AM  Result Value Ref Range   Sodium 122 (L) 135 - 145 mmol/L   Potassium 3.6 3.5 - 5.1 mmol/L   Chloride 93 (L) 98 - 111 mmol/L   CO2 19 (L) 22 - 32 mmol/L   Glucose,  Bld 240 (H) 70 - 99 mg/dL    Comment: Glucose reference range applies only to samples taken after fasting for at least 8 hours.   BUN 17 8 - 23 mg/dL   Creatinine, Ser 0.76 0.44 - 1.00 mg/dL   Calcium 7.4 (L) 8.9 - 10.3 mg/dL   GFR, Estimated >60 >60 mL/min    Comment: (NOTE) Calculated using the CKD-EPI Creatinine Equation (2021)    Anion gap 10 5 - 15    Comment: Performed at [facility] [address] [place] [address]  [place], [place] [id number]  Basic metabolic panel     Status: Abnormal   Collection Time: 11/04/22  8:13 AM  Result Value Ref Range   Sodium 123 (L) 135 - 145 mmol/L   Potassium 4.0 3.5 - 5.1 mmol/L   Chloride 96 (L) 98 - 111 mmol/L   CO2 16 (L) 22 - 32 mmol/L   Glucose, Bld 272 (H) 70 - 99 mg/dL    Comment: Glucose reference range applies only to samples taken after fasting for at least 8 hours.   BUN 16 8 - 23 mg/dL   Creatinine, Ser 0.82 0.44 - 1.00 mg/dL   Calcium 7.5 (L) 8.9 - 10.3 mg/dL   GFR, Estimated >60 >60 mL/min    Comment: (NOTE) Calculated using the CKD-EPI Creatinine Equation (2021)    Anion gap 11 5 - 15    Comment: Performed at [facility] [address] [place],  [address]  CBG monitoring, ED     Status: Abnormal   Collection Time: 11/04/22 11:51 AM  Result Value Ref Range   Glucose-Capillary 296 (H) 70 - 99 mg/dL    Comment: Glucose reference range applies only to samples taken after fasting for at least 8 hours.   CT Head Wo Contrast  Result Date: 11/11/2022 CLINICAL DATA:  Trauma, fall EXAM: CT HEAD WITHOUT CONTRAST TECHNIQUE: Contiguous axial images were obtained from the base of the skull through the vertex without intravenous contrast. RADIATION DOSE REDUCTION: This exam was performed according to the departmental dose-optimization program which includes automated exposure control, adjustment of the mA and/or kV according to patient size and/or use of iterative reconstruction technique. COMPARISON:  None Available. FINDINGS: Brain: No acute intracranial findings are seen. There are no signs of bleeding within the cranium. Cortical sulci are prominent. Vascular: Unremarkable. Skull: No fracture is seen in calvarium. Sinuses/Orbits: There is mucosal thickening in ethmoid sinus. Other: None. IMPRESSION: No acute intracranial findings are seen in noncontrast CT brain. Atrophy. Mild chronic sinusitis. Electronically Signed   By: Elmer Picker M.D.   On:  11/14/2022 19:07   CT Angio Chest PE W and/or Wo Contrast  Result Date: 11/18/2022 CLINICAL DATA:  Lung consolidation EXAM: CT ANGIOGRAPHY CHEST WITH CONTRAST TECHNIQUE: Multidetector CT imaging of the chest was performed using the standard protocol during bolus administration of intravenous contrast. Multiplanar CT image reconstructions and MIPs were obtained to evaluate the vascular anatomy. RADIATION DOSE REDUCTION: This exam was performed according to the departmental dose-optimization program which includes automated exposure control, adjustment of the mA and/or kV according to patient size and/or use of iterative reconstruction technique. CONTRAST:  82m OMNIPAQUE IOHEXOL 350 MG/ML SOLN COMPARISON:  Chest radiograph done earlier today FINDINGS: Cardiovascular: Scattered calcifications are seen in thoracic aorta and its major branches. There is possible significant stenosis in proximal left subclavian. Coronary artery calcifications are seen. Dense calcification is seen in mitral annulus. Mediastinum/Nodes: No significant lymphadenopathy seen in mediastinum. There  are slightly enlarged lymph nodes in both hilar regions, more so on the right side. Lungs/Pleura: There is large alveolar infiltrate in posterior segment of right upper lobe. There are patchy infiltrates in anterior and apical segments of right upper lobe, right middle lobe, lingula and right lower lobe. There is small right pleural effusion. There is no pneumothorax. Upper Abdomen: There is nodularity in liver surface. Small hiatal hernia is seen. Musculoskeletal: Degenerative changes are noted in lower cervical spine. Review of the MIP images confirms the above findings. IMPRESSION: There is large alveolar infiltrate involving most of the posterior segment of right upper lobe suggesting pneumonia. There are other small patchy infiltrates in the rest of the right upper lobe, lingula and right lower lobe suggesting multifocal pneumonia. Follow-up  studies after antibiotic treatment should be considered to rule out any underlying obstructing neoplastic process. Small right pleural effusion.  There is no pneumothorax. Coronary artery disease. Thoracic aortic atherosclerosis. Possible significant stenosis in proximal left subclavian artery. Cirrhosis.  Small hiatal hernia. Electronically Signed   By: Elmer Picker M.D.   On: 11/16/2022 19:03   DG Chest 2 View  Result Date: 11/26/2022 CLINICAL DATA:  Weakness EXAM: CHEST - 2 VIEW COMPARISON:  None Available. FINDINGS: No pleural effusion. No pneumothorax. Normal cardiac and mediastinal contours. There is a large consolidative opacity in the right mid lung, which could represent infection, further evaluation with a CT of the chest is recommended exclude the possibility of an underlying malignancy. No radiographically apparent displaced rib fractures. Visualized upper abdomen is unremarkable. Vertebral body heights are maintained. Aortic atherosclerotic calcifications. IMPRESSION: Large consolidative opacity in the right mid lung could represent infection, but further evaluation with a CT of the chest is recommended to exclude the possibility of an underlying malignancy. Electronically Signed   By: Marin Roberts M.D.   On: 11/14/2022 16:49    Pending Labs Unresulted Labs (From admission, onward)     Start     Ordered   11/10/22 0500  Creatinine, serum  (enoxaparin (LOVENOX)    CrCl >/= 30 ml/min)  Weekly,   R     Comments: while on enoxaparin therapy    10/29/2022 2228   11/05/22 0500  CBC  Every 48 hours,   R      11/04/22 1209   11/05/22 [id number]  Basic metabolic panel  Every 48 hours,   R      11/04/22 1209   11/15/2022 2228  Legionella Pneumophila Serogp 1 Ur Ag  Once,   R        10/29/2022 2228            Vitals/Pain Today's Vitals   11/04/22 0500 11/04/22 0800 11/04/22 0900 11/04/22 0929  BP: (!) 147/63 (!) 155/73 130/69   Pulse: 71 73 72   Resp: '16 17 19   '$ Temp: 98.9 F (37.2 C)    97.6 F (36.4 C)  TempSrc:    Oral  SpO2: 97% 95% 96%   PainSc:        Isolation Precautions No active isolations  Medications Medications  insulin aspart (novoLOG) injection 0-15 Units (8 Units Subcutaneous Given 11/04/22 0757)  insulin aspart (novoLOG) injection 0-5 Units (3 Units Subcutaneous Given 10/28/2022 2336)  atenolol (TENORMIN) tablet 100 mg (100 mg Oral Given 11/04/22 1130)  budesonide (ENTOCORT EC) 24 hr capsule 3 mg (3 mg Oral Given 11/04/22 1129)  cyanocobalamin (VITAMIN B12) tablet 2,500 mcg (2,500 mcg Oral Given 11/04/22 1129)  ezetimibe (ZETIA) tablet 10 mg (  10 mg Oral Given 11/04/22 1129)  pravastatin (PRAVACHOL) tablet 40 mg (40 mg Oral Given 11/04/22 1129)  pyridOXINE (VITAMIN B6) tablet 100 mg (100 mg Oral Given 11/04/22 1129)  sucralfate (CARAFATE) tablet 1 g (1 g Oral Given 11/04/22 1129)  aspirin EC tablet 81 mg (81 mg Oral Given 11/04/22 1129)  famotidine (PEPCID) tablet 40 mg (40 mg Oral Given 11/04/22 1129)  insulin glargine-yfgn (SEMGLEE) injection 30 Units (0 Units Subcutaneous Hold 11/20/2022 2147)  guaiFENesin (ROBITUSSIN) 100 MG/5ML liquid 5 mL (has no administration in time range)  0.9 % NaCl with KCl 20 mEq/ L  infusion ( Intravenous New Bag/Given 11/04/22 1136)  enoxaparin (LOVENOX) injection 40 mg (40 mg Subcutaneous Given 10/31/2022 2337)  cefTRIAXone (ROCEPHIN) 2 g in sodium chloride 0.9 % 100 mL IVPB (has no administration in time range)  azithromycin (ZITHROMAX) 500 mg in sodium chloride 0.9 % 250 mL IVPB (has no administration in time range)  diltiazem (CARDIZEM CD) 24 hr capsule 240 mg (240 mg Oral Given 11/04/22 1129)  buPROPion (WELLBUTRIN XL) 24 hr tablet 300 mg (300 mg Oral Given 11/04/22 1129)  irbesartan (AVAPRO) tablet 150 mg (0 mg Oral Hold 11/04/22 0432)  lactated ringers bolus 1,000 mL (0 mLs Intravenous Stopped 11/19/2022 2034)  ipratropium-albuterol (DUONEB) 0.5-2.5 (3) MG/3ML nebulizer solution 3 mL (3 mLs Nebulization Given 11/21/2022 1855)  methylPREDNISolone sodium  succinate (SOLU-MEDROL) 125 mg/2 mL injection 125 mg (125 mg Intravenous Given 11/26/2022 1854)  iohexol (OMNIPAQUE) 350 MG/ML injection 55 mL (55 mLs Intravenous Contrast Given 11/04/2022 1822)  potassium chloride SA (KLOR-CON M) CR tablet 40 mEq (40 mEq Oral Given 11/19/2022 1932)  sodium chloride 0.9 % bolus 1,000 mL (0 mLs Intravenous Stopped 10/30/2022 2147)  cefTRIAXone (ROCEPHIN) 2 g in sodium chloride 0.9 % 100 mL IVPB (0 g Intravenous Stopped 11/26/2022 2119)  azithromycin (ZITHROMAX) 500 mg in sodium chloride 0.9 % 250 mL IVPB (0 mg Intravenous Stopped 11/25/2022 2146)  benzonatate (TESSALON) capsule 100 mg (100 mg Oral Given 11/02/2022 2332)  potassium chloride SA (KLOR-CON M) CR tablet 40 mEq (40 mEq Oral Given 11/04/22 0130)    Mobility walks     Focused Assessments Pulmonary Assessment Handoff:  Lung sounds: Bilateral Breath Sounds: Clear O2 Device: Room Air O2 Flow Rate (L/min): 2 L/min    R Recommendations: See Admitting Provider Note  Report given to:   Additional Notes:

## 2022-11-04 NOTE — Progress Notes (Signed)
PROGRESS NOTE    Sarah Key  [id number] DOB: 1945/11/18 DOA: 10/31/2022 PCP: Hoyt Koch, MD   Brief Narrative:  Patient is a 77 year old female with past medical history HLD, DM, HTN, RLS, and depression.  She presents with 1 week of severe bilateral lower rib pain and lethargy.  Imaging in the ED remarkable for large right upper lobe infiltrates consistent with pneumonia.  Assessment & Plan:   Principal Problem:   CAP (community acquired pneumonia) Active Problems:   Diabetes mellitus ([place])   Hypertension   Colitis   RLS (restless legs syndrome)   Hyponatremia   Hypokalemia   Hyperlipidemia   Cirrhosis (HCC)   Acute hypoxic respiratory failure secondary to commune acquired pneumonia -Patient meets sepsis criteria given leukocytosis tachypnea with source of pneumonia -CT confirms right upper lobe infiltrates -Continue to wean oxygen as tolerated -Continue ceftriaxone and azithromycin -Blood cultures pending -Radiology recommending repeat CT postinfection to rule out any further underlying process   Hypovolemic hyponatremia Secondary to poor p.o. intake, improving with IV fluids  118 at intake, currently 122  Possibly exacerbated by chlorthalidone  PVC -Continue diltiazem  Hypokalemia -Resolved   Hypertension -Continue diltiazem, atenolol and olmesartan -Chlorthalidone on hold   Hyperlipidemia -Pravastatin, Zetia resumed   Diabetes mellitus, uncontrolled with hyperglycemia -Lantus resumed, sliding scale insulin and hypoglycemic protocol ongoing   Chronic colitis - UC vs lymphocytic colitis -Continue budesonide 3 mg daily -Continue Pepcid, sucralfate and Entocort  DVT prophylaxis: Lovenox Code Status: Full Family Communication: None present  Status is: Inpatient  Dispo: The patient is from: Home              Anticipated d/c is to: Home              Anticipated d/c date is: 24 to 48 hours              Patient currently not  medically stable for discharge  Consultants:  None  Procedures:  None  Antimicrobials:  Azithromycin, ceftriaxone x 5 days  Subjective: No acute issues or events overnight, respiratory status unchanged, somewhat less tired/weak and fatigued.  Otherwise denies chest pain nausea vomiting diarrhea constipation headache fevers or chills  Objective: Vitals:   11/13/2022 2007 11/04/22 0100 11/04/22 0400 11/04/22 0500  BP: (!) 116/96 (!) 158/83 130/68 (!) 147/63  Pulse: 77 73 72 71  Resp: '11 18 18 16  '$ Temp: 99.3 F (37.4 C) 99.3 F (37.4 C)  98.9 F (37.2 C)  TempSrc: Oral Oral    SpO2: 92% 95% 96% 97%    Intake/Output Summary (Last 24 hours) at 11/04/2022 [id number] Last data filed at 11/13/2022 2147 Gross per 24 hour  Intake 2349.59 ml  Output --  Net 2349.59 ml   There were no vitals filed for this visit.  Examination:  General:  Pleasantly resting in bed, No acute distress. HEENT:  Normocephalic atraumatic.  Sclerae nonicteric, noninjected.  Extraocular movements intact bilaterally. Neck:  Without mass or deformity.  Trachea is midline. Lungs:  R sided rhonchi noted. Heart:  Regular rate and rhythm.  Without murmurs, rubs, or gallops. Abdomen:  Soft, nontender, nondistended.  Without guarding or rebound. Extremities: Without cyanosis, clubbing, edema, or obvious deformity. Vascular:  Dorsalis pedis and posterior tibial pulses palpable bilaterally. Skin:  Warm and dry, no erythema, no ulcerations.  Data Reviewed: I have personally reviewed following labs and imaging studies  CBC: Recent Labs  Lab 10/31/2022 1609 11/02/2022 2357 11/04/22 0251  WBC 12.1* 10.4 11.0*  NEUTROABS 9.4*  --  9.4*  HGB 11.4* 11.1* 11.2*  HCT 32.3* 33.5* 32.1*  MCV 81.6 84.8 80.7  PLT 277 235 [id number]   Basic Metabolic Panel: Recent Labs  Lab 11/25/2022 1609 10/28/2022 2045 11/24/2022 2357 11/04/22 0251  NA 119* 119*  --  122*  K 2.9* 3.0*  --  3.6  CL 85* 85*  --  93*  CO2 20* 23  --  19*  GLUCOSE  225* 204*  --  240*  BUN 24* 20  --  17  CREATININE 1.08* 0.90 0.89 0.76  CALCIUM 7.8* 7.7*  --  7.4*  MG  --  2.0  --   --    GFR: Estimated Creatinine Clearance: 58 mL/min (by C-G formula based on SCr of 0.76 mg/dL).  Liver Function Tests: Recent Labs  Lab 11/05/2022 1609  AST 38  ALT 35  ALKPHOS 60  BILITOT 0.8  PROT 5.7*  ALBUMIN 2.9*   CBG: Recent Labs  Lab 11/21/2022 2109 11/02/2022 2237  GLUCAP 227* 252*   Thyroid Function Tests: Recent Labs    11/15/2022 2045  TSH 0.414   Recent Results (from the past 240 hour(s))  Resp panel by RT-PCR (RSV, Flu A&B, Covid) Anterior Nasal Swab     Status: None   Collection Time: 11/06/2022  4:17 PM   Specimen: Anterior Nasal Swab  Result Value Ref Range Status   SARS Coronavirus 2 by RT PCR NEGATIVE NEGATIVE Final   Influenza A by PCR NEGATIVE NEGATIVE Final   Influenza B by PCR NEGATIVE NEGATIVE Final    Comment: (NOTE) The Xpert Xpress SARS-CoV-2/FLU/RSV plus assay is intended as an aid in the diagnosis of influenza from Nasopharyngeal swab specimens and should not be used as a sole basis for treatment. Nasal washings and aspirates are unacceptable for Xpert Xpress SARS-CoV-2/FLU/RSV testing.  Fact Sheet for Patients: EntrepreneurPulse.com.au  Fact Sheet for Healthcare Providers: IncredibleEmployment.be  This test is not yet approved or cleared by the [place] FDA and has been authorized for detection and/or diagnosis of SARS-CoV-2 by FDA under an Emergency Use Authorization (EUA). This EUA will remain in effect (meaning this test can be used) for the duration of the COVID-19 declaration under Section 564(b)(1) of the Act, 21 U.S.C. section 360bbb-3(b)(1), unless the authorization is terminated or revoked.     Resp Syncytial Virus by PCR NEGATIVE NEGATIVE Final    Comment: (NOTE) Fact Sheet for Patients: EntrepreneurPulse.com.au  Fact Sheet for Healthcare  Providers: IncredibleEmployment.be  This test is not yet approved or cleared by the [place] FDA and has been authorized for detection and/or diagnosis of SARS-CoV-2 by FDA under an Emergency Use Authorization (EUA). This EUA will remain in effect (meaning this test can be used) for the duration of the COVID-19 declaration under Section 564(b)(1) of the Act, 21 U.S.C. section 360bbb-3(b)(1), unless the authorization is terminated or revoked.  Performed at [facility], [place] [address], [place], [place] [address]          Radiology Studies: CT Head Wo Contrast  Result Date: 11/20/2022 CLINICAL DATA:  Trauma, fall EXAM: CT HEAD WITHOUT CONTRAST TECHNIQUE: Contiguous axial images were obtained from the base of the skull through the vertex without intravenous contrast. RADIATION DOSE REDUCTION: This exam was performed according to the departmental dose-optimization program which includes automated exposure control, adjustment of the mA and/or kV according to patient size and/or use of iterative reconstruction technique. COMPARISON:  None Available. FINDINGS: Brain: No acute intracranial findings  are seen. There are no signs of bleeding within the cranium. Cortical sulci are prominent. Vascular: Unremarkable. Skull: No fracture is seen in calvarium. Sinuses/Orbits: There is mucosal thickening in ethmoid sinus. Other: None. IMPRESSION: No acute intracranial findings are seen in noncontrast CT brain. Atrophy. Mild chronic sinusitis. Electronically Signed   By: Elmer Picker M.D.   On: 11/17/2022 19:07   CT Angio Chest PE W and/or Wo Contrast  Result Date: 11/22/2022 CLINICAL DATA:  Lung consolidation EXAM: CT ANGIOGRAPHY CHEST WITH CONTRAST TECHNIQUE: Multidetector CT imaging of the chest was performed using the standard protocol during bolus administration of intravenous contrast. Multiplanar CT image reconstructions and MIPs were obtained to evaluate the vascular  anatomy. RADIATION DOSE REDUCTION: This exam was performed according to the departmental dose-optimization program which includes automated exposure control, adjustment of the mA and/or kV according to patient size and/or use of iterative reconstruction technique. CONTRAST:  52m OMNIPAQUE IOHEXOL 350 MG/ML SOLN COMPARISON:  Chest radiograph done earlier today FINDINGS: Cardiovascular: Scattered calcifications are seen in thoracic aorta and its major branches. There is possible significant stenosis in proximal left subclavian. Coronary artery calcifications are seen. Dense calcification is seen in mitral annulus. Mediastinum/Nodes: No significant lymphadenopathy seen in mediastinum. There are slightly enlarged lymph nodes in both hilar regions, more so on the right side. Lungs/Pleura: There is large alveolar infiltrate in posterior segment of right upper lobe. There are patchy infiltrates in anterior and apical segments of right upper lobe, right middle lobe, lingula and right lower lobe. There is small right pleural effusion. There is no pneumothorax. Upper Abdomen: There is nodularity in liver surface. Small hiatal hernia is seen. Musculoskeletal: Degenerative changes are noted in lower cervical spine. Review of the MIP images confirms the above findings. IMPRESSION: There is large alveolar infiltrate involving most of the posterior segment of right upper lobe suggesting pneumonia. There are other small patchy infiltrates in the rest of the right upper lobe, lingula and right lower lobe suggesting multifocal pneumonia. Follow-up studies after antibiotic treatment should be considered to rule out any underlying obstructing neoplastic process. Small right pleural effusion.  There is no pneumothorax. Coronary artery disease. Thoracic aortic atherosclerosis. Possible significant stenosis in proximal left subclavian artery. Cirrhosis.  Small hiatal hernia. Electronically Signed   By: PElmer PickerM.D.   On:  11/08/2022 19:03   DG Chest 2 View  Result Date: 10/28/2022 CLINICAL DATA:  Weakness EXAM: CHEST - 2 VIEW COMPARISON:  None Available. FINDINGS: No pleural effusion. No pneumothorax. Normal cardiac and mediastinal contours. There is a large consolidative opacity in the right mid lung, which could represent infection, further evaluation with a CT of the chest is recommended exclude the possibility of an underlying malignancy. No radiographically apparent displaced rib fractures. Visualized upper abdomen is unremarkable. Vertebral body heights are maintained. Aortic atherosclerotic calcifications. IMPRESSION: Large consolidative opacity in the right mid lung could represent infection, but further evaluation with a CT of the chest is recommended to exclude the possibility of an underlying malignancy. Electronically Signed   By: HMarin RobertsM.D.   On: 11/19/2022 16:49    Scheduled Meds:  aspirin EC  81 mg Oral Daily   atenolol  100 mg Oral Daily   budesonide  3 mg Oral Daily   buPROPion  300 mg Oral Daily   cyanocobalamin  2,500 mcg Oral Daily   diltiazem  240 mg Oral Daily   enoxaparin (LOVENOX) injection  40 mg Subcutaneous Q24H   ezetimibe  10 mg  Oral Daily   famotidine  40 mg Oral q morning   insulin aspart  0-15 Units Subcutaneous TID WC   insulin aspart  0-5 Units Subcutaneous QHS   insulin glargine-yfgn  30 Units Subcutaneous QHS   irbesartan  150 mg Oral Daily   pravastatin  40 mg Oral Daily   pyridOXINE  100 mg Oral Daily   sucralfate  1 g Oral BID   Continuous Infusions:  0.9 % NaCl with KCl 20 mEq / L 100 mL/hr at 11/04/22 0422   azithromycin     cefTRIAXone (ROCEPHIN)  IV       LOS: 1 day   Time spent: 66mn  Zavian Slowey C Dontea Corlew, DO [facility]  If 7PM-7AM, please contact night-coverage [email or web address]  11/04/2022, 7:52 AM

## 2022-11-04 NOTE — ED Notes (Signed)
Pt report received from previous nurse. Pt A&O x4, vitals stable, denies needs/complaints. Call bell in reach. No acute distress noted.  

## 2022-11-05 DIAGNOSIS — J189 Pneumonia, unspecified organism: Secondary | ICD-10-CM | POA: Diagnosis not present

## 2022-11-05 LAB — CBC
HCT: 32.7 % — ABNORMAL LOW (ref 36.0–46.0)
Hemoglobin: 10.9 g/dL — ABNORMAL LOW (ref 12.0–15.0)
MCH: 27.9 pg (ref 26.0–34.0)
MCHC: 33.3 g/dL (ref 30.0–36.0)
MCV: 83.8 fL (ref 80.0–100.0)
Platelets: 325 10*3/uL (ref 150–400)
RBC: 3.9 MIL/uL (ref 3.87–5.11)
RDW: 13.6 % (ref 11.5–15.5)
WBC: 20.4 10*3/uL — ABNORMAL HIGH (ref 4.0–10.5)
nRBC: 0 % (ref 0.0–0.2)

## 2022-11-05 LAB — GLUCOSE, CAPILLARY
Glucose-Capillary: 133 mg/dL — ABNORMAL HIGH (ref 70–99)
Glucose-Capillary: 216 mg/dL — ABNORMAL HIGH (ref 70–99)
Glucose-Capillary: 170 mg/dL — ABNORMAL HIGH (ref 70–99)
Glucose-Capillary: 199 mg/dL — ABNORMAL HIGH (ref 70–99)

## 2022-11-05 LAB — BASIC METABOLIC PANEL
Anion gap: 10 (ref 5–15)
BUN: 15 mg/dL (ref 8–23)
CO2: 18 mmol/L — ABNORMAL LOW (ref 22–32)
Calcium: 7.5 mg/dL — ABNORMAL LOW (ref 8.9–10.3)
Chloride: 98 mmol/L (ref 98–111)
Creatinine, Ser: 0.67 mg/dL (ref 0.44–1.00)
GFR, Estimated: >60 mL/min (ref >60)
Glucose, Bld: 137 mg/dL — ABNORMAL HIGH (ref 70–99)
Potassium: 3.5 mmol/L (ref 3.5–5.1)
Sodium: 126 mmol/L — ABNORMAL LOW (ref 135–145)

## 2022-11-05 MED ORDER — ALBUTEROL SULFATE (2.5 MG/3ML) 0.083% IN NEBU
2.5000 mg | INHALATION_SOLUTION | Freq: Three times a day (TID) | RESPIRATORY_TRACT | Status: DC
  Administered 2022-11-05: 2.5 mg via RESPIRATORY_TRACT
  Filled 2022-11-05: qty 3

## 2022-11-05 MED ORDER — ALBUTEROL SULFATE (2.5 MG/3ML) 0.083% IN NEBU
2.5000 mg | INHALATION_SOLUTION | Freq: Two times a day (BID) | RESPIRATORY_TRACT | Status: DC
  Administered 2022-11-05 – 2022-11-08 (×6): 2.5 mg via RESPIRATORY_TRACT
  Filled 2022-11-05 (×7): qty 3

## 2022-11-05 MED ORDER — MELATONIN 3 MG PO TABS
3.0000 mg | ORAL_TABLET | Freq: Every evening | ORAL | Status: DC | PRN
  Administered 2022-11-05 – 2022-11-06 (×2): 3 mg via ORAL
  Filled 2022-11-05 (×2): qty 1

## 2022-11-05 MED ORDER — ALBUTEROL SULFATE (2.5 MG/3ML) 0.083% IN NEBU
INHALATION_SOLUTION | RESPIRATORY_TRACT | Status: AC
  Administered 2022-11-05: 2.5 mg
  Filled 2022-11-05: qty 3

## 2022-11-05 MED ORDER — AZITHROMYCIN 500 MG PO TABS
500.0000 mg | ORAL_TABLET | Freq: Every day | ORAL | Status: AC
  Administered 2022-11-05 – 2022-11-07 (×3): 500 mg via ORAL
  Filled 2022-11-05 (×4): qty 1

## 2022-11-05 NOTE — Progress Notes (Signed)
RT called to pt's room due to Sp02 in the 70's.  Pt on 6L HFNC increased 02 to 15L. Breathing tx given by RN and RT gave a 2nd breathing tx due to wheezing. Pt still stating she felt like it was hart to take a deep breath and increased WOB. Pt placed on Bipap and MD notified.

## 2022-11-05 NOTE — Progress Notes (Signed)
PROGRESS NOTE    Sarah Key  [id number] DOB: [birth year] DOA: 10/27/2022 PCP: Hoyt Koch, MD   Brief Narrative:  Patient is a 77 year old female with past medical history HLD, DM, HTN, RLS, and depression.  She presents with 1 week of severe bilateral lower rib pain and lethargy.  Imaging in the ED remarkable for large right upper lobe infiltrates consistent with pneumonia.  Assessment & Plan:   Principal Problem:   CAP (community acquired pneumonia) Active Problems:   Diabetes mellitus ([place])   Hypertension   Colitis   RLS (restless legs syndrome)   Hyponatremia   Hypokalemia   Hyperlipidemia   Cirrhosis (HCC)  Acute hypoxic respiratory failure secondary to commune acquired pneumonia -Patient meets sepsis criteria given leukocytosis tachypnea with source of pneumonia -CT confirms right upper lobe infiltrates -Continue to wean oxygen as tolerated - remains moderately hypoxic today stressed the need to mobilize/prone/rotate positions in bed -Continue ceftriaxone and azithromycin -Blood cultures pending -Radiology recommending repeat CT postinfection to rule out any further underlying process   Hypovolemic hyponatremia Secondary to poor p.o. intake, improved with IV fluids - decreased to 20cc/hr 118 at intake, currently 126 Possibly exacerbated by chlorthalidone Continue to encourage PO intake  PVC -Continue diltiazem  Hypokalemia -Resolved   Hypertension -Continue diltiazem, atenolol and olmesartan -Chlorthalidone on hold   Hyperlipidemia -Pravastatin, Zetia resumed   Diabetes mellitus, uncontrolled with hyperglycemia -Lantus resumed, sliding scale insulin and hypoglycemic protocol ongoing   Chronic colitis - UC vs lymphocytic colitis -Continue budesonide 3 mg daily -Continue Pepcid, sucralfate and Entocort  DVT prophylaxis: Lovenox Code Status: Full Family Communication: Husband at bedside.  Status is: Inpatient  Dispo: The patient  is from: Home              Anticipated d/c is to: Home              Anticipated d/c date is: 24 to 48 hours              Patient currently not medically stable for discharge  Consultants:  None  Procedures:  None  Antimicrobials:  Azithromycin, ceftriaxone x 5 days  Subjective: No acute issues or events overnight, respiratory status minimally improved denies nausea vomiting diarrhea constipation headache fevers chills or chest pain  Objective: Vitals:   11/04/22 2034 11/04/22 2323 11/05/22 0316 11/05/22 0348  BP:  124/72  (!) 103/90  Pulse:  67  65  Resp:  19  20  Temp:  98.1 F (36.7 C)  97.8 F (36.6 C)  TempSrc:  Oral  Oral  SpO2: 93% 91% 94% 94%   No intake or output data in the 24 hours ending 11/05/22 0800  There were no vitals filed for this visit.  Examination:  General:  Pleasantly resting in bed, No acute distress. HEENT:  Normocephalic atraumatic.  Sclerae nonicteric, noninjected.  Extraocular movements intact bilaterally. Neck:  Without mass or deformity.  Trachea is midline. Lungs:  R sided rhonchi noted. Heart:  Regular rate and rhythm.  Without murmurs, rubs, or gallops. Abdomen:  Soft, nontender, nondistended.  Without guarding or rebound. Extremities: Without cyanosis, clubbing, edema, or obvious deformity. Vascular:  Dorsalis pedis and posterior tibial pulses palpable bilaterally. Skin:  Warm and dry, no erythema, no ulcerations.  Data Reviewed: I have personally reviewed following labs and imaging studies  CBC: Recent Labs  Lab 10/27/2022 1609 11/12/2022 2357 11/04/22 0251 11/05/22 0546  WBC 12.1* 10.4 11.0* 20.4*  NEUTROABS 9.4*  --  9.4*  --  HGB 11.4* 11.1* 11.2* 10.9*  HCT 32.3* 33.5* 32.1* 32.7*  MCV 81.6 84.8 80.7 83.8  PLT 277 235 257 [id number]    Basic Metabolic Panel: Recent Labs  Lab 10/29/2022 1609 11/18/2022 2045 11/06/2022 2357 11/04/22 0251 11/04/22 0813 11/05/22 0546  NA 119* 119*  --  122* 123* 126*  K 2.9* 3.0*  --  3.6 4.0  3.5  CL 85* 85*  --  93* 96* 98  CO2 20* 23  --  19* 16* 18*  GLUCOSE 225* 204*  --  240* 272* 137*  BUN 24* 20  --  '17 16 15  '$ CREATININE 1.08* 0.90 0.89 0.76 0.82 0.67  CALCIUM 7.8* 7.7*  --  7.4* 7.5* 7.5*  MG  --  2.0  --   --   --   --     GFR: Estimated Creatinine Clearance: 58 mL/min (by C-G formula based on SCr of 0.67 mg/dL).  Liver Function Tests: Recent Labs  Lab 11/19/2022 1609  AST 38  ALT 35  ALKPHOS 60  BILITOT 0.8  PROT 5.7*  ALBUMIN 2.9*    CBG: Recent Labs  Lab 10/31/2022 2109 11/13/2022 2237 11/04/22 1151 11/04/22 2013 11/05/22 0745  GLUCAP 227* 252* 296* 206* 133*    Thyroid Function Tests: Recent Labs    11/21/2022 2045  TSH 0.414    Recent Results (from the past 240 hour(s))  Resp panel by RT-PCR (RSV, Flu A&B, Covid) Anterior Nasal Swab     Status: None   Collection Time: 11/26/2022  4:17 PM   Specimen: Anterior Nasal Swab  Result Value Ref Range Status   SARS Coronavirus 2 by RT PCR NEGATIVE NEGATIVE Final   Influenza A by PCR NEGATIVE NEGATIVE Final   Influenza B by PCR NEGATIVE NEGATIVE Final    Comment: (NOTE) The Xpert Xpress SARS-CoV-2/FLU/RSV plus assay is intended as an aid in the diagnosis of influenza from Nasopharyngeal swab specimens and should not be used as a sole basis for treatment. Nasal washings and aspirates are unacceptable for Xpert Xpress SARS-CoV-2/FLU/RSV testing.  Fact Sheet for Patients: EntrepreneurPulse.com.au  Fact Sheet for Healthcare Providers: IncredibleEmployment.be  This test is not yet approved or cleared by the [place] FDA and has been authorized for detection and/or diagnosis of SARS-CoV-2 by FDA under an Emergency Use Authorization (EUA). This EUA will remain in effect (meaning this test can be used) for the duration of the COVID-19 declaration under Section 564(b)(1) of the Act, 21 U.S.C. section 360bbb-3(b)(1), unless the authorization is terminated  or revoked.     Resp Syncytial Virus by PCR NEGATIVE NEGATIVE Final    Comment: (NOTE) Fact Sheet for Patients: EntrepreneurPulse.com.au  Fact Sheet for Healthcare Providers: IncredibleEmployment.be  This test is not yet approved or cleared by the [place] FDA and has been authorized for detection and/or diagnosis of SARS-CoV-2 by FDA under an Emergency Use Authorization (EUA). This EUA will remain in effect (meaning this test can be used) for the duration of the COVID-19 declaration under Section 564(b)(1) of the Act, 21 U.S.C. section 360bbb-3(b)(1), unless the authorization is terminated or revoked.  Performed at [facility], [place] [address] [place], [place] [address]   Blood culture (routine x 2)     Status: None (Preliminary result)   Collection Time: 11/19/2022  8:13 PM   Specimen: BLOOD LEFT HAND  Result Value Ref Range Status   Specimen Description BLOOD LEFT HAND  Final   Special Requests   Final  BOTTLES DRAWN AEROBIC AND ANAEROBIC Blood Culture results may not be optimal due to an inadequate volume of blood received in culture bottles   Culture   Final    NO GROWTH < 12 HOURS Performed at [facility] [address] [place], [place] [address]    Report Status PENDING  Incomplete  Blood culture (routine x 2)     Status: None (Preliminary result)   Collection Time: 10/30/2022  8:19 PM   Specimen: BLOOD RIGHT HAND  Result Value Ref Range Status   Specimen Description BLOOD RIGHT HAND  Final   Special Requests   Final    BOTTLES DRAWN AEROBIC AND ANAEROBIC Blood Culture results may not be optimal due to an inadequate volume of blood received in culture bottles   Culture   Final    NO GROWTH < 12 HOURS Performed at [facility], [place] [address], [place], [place] [address]    Report Status PENDING  Incomplete         Radiology Studies: CT Head Wo Contrast  Result Date: 11/04/2022 CLINICAL DATA:   Trauma, fall EXAM: CT HEAD WITHOUT CONTRAST TECHNIQUE: Contiguous axial images were obtained from the base of the skull through the vertex without intravenous contrast. RADIATION DOSE REDUCTION: This exam was performed according to the departmental dose-optimization program which includes automated exposure control, adjustment of the mA and/or kV according to patient size and/or use of iterative reconstruction technique. COMPARISON:  None Available. FINDINGS: Brain: No acute intracranial findings are seen. There are no signs of bleeding within the cranium. Cortical sulci are prominent. Vascular: Unremarkable. Skull: No fracture is seen in calvarium. Sinuses/Orbits: There is mucosal thickening in ethmoid sinus. Other: None. IMPRESSION: No acute intracranial findings are seen in noncontrast CT brain. Atrophy. Mild chronic sinusitis. Electronically Signed   By: Elmer Picker M.D.   On: 10/27/2022 19:07   CT Angio Chest PE W and/or Wo Contrast  Result Date: 11/02/2022 CLINICAL DATA:  Lung consolidation EXAM: CT ANGIOGRAPHY CHEST WITH CONTRAST TECHNIQUE: Multidetector CT imaging of the chest was performed using the standard protocol during bolus administration of intravenous contrast. Multiplanar CT image reconstructions and MIPs were obtained to evaluate the vascular anatomy. RADIATION DOSE REDUCTION: This exam was performed according to the departmental dose-optimization program which includes automated exposure control, adjustment of the mA and/or kV according to patient size and/or use of iterative reconstruction technique. CONTRAST:  47m OMNIPAQUE IOHEXOL 350 MG/ML SOLN COMPARISON:  Chest radiograph done earlier today FINDINGS: Cardiovascular: Scattered calcifications are seen in thoracic aorta and its major branches. There is possible significant stenosis in proximal left subclavian. Coronary artery calcifications are seen. Dense calcification is seen in mitral annulus. Mediastinum/Nodes: No significant  lymphadenopathy seen in mediastinum. There are slightly enlarged lymph nodes in both hilar regions, more so on the right side. Lungs/Pleura: There is large alveolar infiltrate in posterior segment of right upper lobe. There are patchy infiltrates in anterior and apical segments of right upper lobe, right middle lobe, lingula and right lower lobe. There is small right pleural effusion. There is no pneumothorax. Upper Abdomen: There is nodularity in liver surface. Small hiatal hernia is seen. Musculoskeletal: Degenerative changes are noted in lower cervical spine. Review of the MIP images confirms the above findings. IMPRESSION: There is large alveolar infiltrate involving most of the posterior segment of right upper lobe suggesting pneumonia. There are other small patchy infiltrates in the rest of the right upper lobe, lingula and right lower lobe suggesting  multifocal pneumonia. Follow-up studies after antibiotic treatment should be considered to rule out any underlying obstructing neoplastic process. Small right pleural effusion.  There is no pneumothorax. Coronary artery disease. Thoracic aortic atherosclerosis. Possible significant stenosis in proximal left subclavian artery. Cirrhosis.  Small hiatal hernia. Electronically Signed   By: Elmer Picker M.D.   On: 11/01/2022 19:03   DG Chest 2 View  Result Date: 11/16/2022 CLINICAL DATA:  Weakness EXAM: CHEST - 2 VIEW COMPARISON:  None Available. FINDINGS: No pleural effusion. No pneumothorax. Normal cardiac and mediastinal contours. There is a large consolidative opacity in the right mid lung, which could represent infection, further evaluation with a CT of the chest is recommended exclude the possibility of an underlying malignancy. No radiographically apparent displaced rib fractures. Visualized upper abdomen is unremarkable. Vertebral body heights are maintained. Aortic atherosclerotic calcifications. IMPRESSION: Large consolidative opacity in the right  mid lung could represent infection, but further evaluation with a CT of the chest is recommended to exclude the possibility of an underlying malignancy. Electronically Signed   By: Marin Roberts M.D.   On: 11/20/2022 16:49    Scheduled Meds:  albuterol  2.5 mg Nebulization TID   aspirin EC  81 mg Oral Daily   atenolol  100 mg Oral Daily   budesonide  3 mg Oral Daily   buPROPion  300 mg Oral Daily   cyanocobalamin  2,500 mcg Oral Daily   diltiazem  240 mg Oral Daily   enoxaparin (LOVENOX) injection  40 mg Subcutaneous Q24H   ezetimibe  10 mg Oral Daily   famotidine  40 mg Oral q morning   insulin aspart  0-15 Units Subcutaneous TID WC   insulin aspart  0-5 Units Subcutaneous QHS   insulin glargine-yfgn  30 Units Subcutaneous QHS   irbesartan  150 mg Oral Daily   pravastatin  40 mg Oral Daily   pyridOXINE  100 mg Oral Daily   sucralfate  1 g Oral BID   Continuous Infusions:  0.9 % NaCl with KCl 20 mEq / L 100 mL/hr at 11/04/22 2334   azithromycin 500 mg (11/04/22 1811)   cefTRIAXone (ROCEPHIN)  IV 2 g (11/04/22 1802)     LOS: 2 days   Time spent: 28mn  Sarah Salido C Jaylaa Gallion, DO [facility]  If 7PM-7AM, please contact night-coverage [email or web address]  11/05/2022, 8:00 AM

## 2022-11-05 NOTE — Progress Notes (Signed)
   11/05/22 1957  Assess: MEWS Score  Temp 97.6 F (36.4 C)  BP (!) 149/59  MAP (mmHg) 87  Pulse Rate 64  ECG Heart Rate 64  Resp (!) 27  Level of Consciousness Alert  SpO2 (!) 89 %  O2 Device HFNC  O2 Flow Rate (L/min) 12 L/min  Assess: MEWS Score  MEWS Temp 0  MEWS Systolic 0  MEWS Pulse 0  MEWS RR 2  MEWS LOC 0  MEWS Score 2  MEWS Score Color Yellow  Assess: if the MEWS score is Yellow or Red  Were vital signs taken at a resting state? Yes  Focused Assessment Change from prior assessment (see assessment flowsheet)  Does the patient meet 2 or more of the SIRS criteria? No  Does the patient have a confirmed or suspected source of infection? Yes  Provider and Rapid Response Notified? Yes  MEWS guidelines implemented  No, other (Comment)  Provider Notification  Provider Name/Title Dr.Opyd  Date Provider Notified 11/05/22  Time Provider Notified 2016  Method of Notification  (Secure chat)  Notification Reason Change in status (Pt was desating at low 70s and RR at 30s .)  Provider response Other (Comment)  Date of Provider Response 11/05/22  Assess: SIRS CRITERIA  SIRS Temperature  0  SIRS Pulse 0  SIRS Respirations  1  SIRS WBC 0  SIRS Score Sum  1   Receive the pt from day shift in a respiratory distress.Pt was desating at 22's and RR at 30's.As per report received from day RN,Pt insisted on walking to the bathroom,her sats dropped to 70's on 2ltr [place],then she was put on 6ltr HFNC. Paged the respiratory,gave her a respiratory treatment and put her on 15ltr HFNC.But eventually had to put her in BIPAP.Pt is tolerating BIPAP well and sats are at 95%.

## 2022-11-06 ENCOUNTER — Inpatient Hospital Stay (HOSPITAL_COMMUNITY): Payer: Medicare PPO

## 2022-11-06 DIAGNOSIS — J189 Pneumonia, unspecified organism: Secondary | ICD-10-CM | POA: Diagnosis not present

## 2022-11-06 LAB — GLUCOSE, CAPILLARY
Glucose-Capillary: 225 mg/dL — ABNORMAL HIGH (ref 70–99)
Glucose-Capillary: 113 mg/dL — ABNORMAL HIGH (ref 70–99)
Glucose-Capillary: 228 mg/dL — ABNORMAL HIGH (ref 70–99)
Glucose-Capillary: 217 mg/dL — ABNORMAL HIGH (ref 70–99)
Glucose-Capillary: 170 mg/dL — ABNORMAL HIGH (ref 70–99)

## 2022-11-06 LAB — BLOOD GAS, ARTERIAL
Acid-base deficit: 1.4 mmol/L (ref 0.0–2.0)
Bicarbonate: 20.9 mmol/L (ref 20.0–28.0)
Drawn by: 59156
O2 Saturation: 91.9 %
Patient temperature: 36.6
pCO2 arterial: 28 mmHg — ABNORMAL LOW (ref 32–48)
pH, Arterial: 7.49 — ABNORMAL HIGH (ref 7.35–7.45)
pO2, Arterial: 57 mmHg — ABNORMAL LOW (ref 83–108)

## 2022-11-06 MED ORDER — IPRATROPIUM-ALBUTEROL 0.5-2.5 (3) MG/3ML IN SOLN
3.0000 mL | RESPIRATORY_TRACT | Status: DC | PRN
  Administered 2022-11-06 – 2022-11-08 (×4): 3 mL via RESPIRATORY_TRACT
  Filled 2022-11-06 (×4): qty 3

## 2022-11-06 MED ORDER — FUROSEMIDE 10 MG/ML IJ SOLN
40.0000 mg | Freq: Once | INTRAMUSCULAR | Status: AC
  Administered 2022-11-06: 40 mg via INTRAVENOUS
  Filled 2022-11-06: qty 4

## 2022-11-06 MED ORDER — LORAZEPAM 0.5 MG PO TABS
0.5000 mg | ORAL_TABLET | Freq: Once | ORAL | Status: AC
  Administered 2022-11-06: 0.5 mg via ORAL
  Filled 2022-11-06: qty 1

## 2022-11-06 MED ORDER — HYDROCORTISONE SOD SUC (PF) 250 MG IJ SOLR
200.0000 mg | Freq: Every day | INTRAMUSCULAR | Status: DC
  Administered 2022-11-06 – 2022-11-08 (×3): 200 mg via INTRAVENOUS
  Filled 2022-11-06 (×4): qty 200

## 2022-11-06 NOTE — Care Management Important Message (Signed)
Important Message  Patient Details  Name: Sarah Key MRN: [id number] Date of Birth: 25-Aug-1946   Medicare Important Message Given:  Yes     Terrance Usery [address] 11/06/2022, 3:30 PM

## 2022-11-06 NOTE — Consult Note (Signed)
NAME:  Sarah Key, MRN:  [id number], DOB:  [birth year], LOS: 3 ADMISSION DATE:  11/13/2022, CONSULTATION DATE:  11/06/22 REFERRING MD:  Dr. Avon Gully, CHIEF COMPLAINT: SOB   History of Present Illness:  77 y/o F who presented to her PCP on 3/8 with reports of frequent falls, dizziness.   On presentation to her PCP, she was documented to be ill appearing, short of breath, pale, & confused. She had apparently hit her head with one of her multiple recent falls. She was referred to the ER via EMS. On arrival to the ER, she also reported bilateral rib pain with inspiration and cough with green sputum production. Work up notable for large consolidative opacity on the right. 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 CT head imaging negative for acute process. She was admitted by [facility], treated as CAP. COVID/Flu A&B/RSV screening negative. Her initial O2 needs were 2L but over her course of admission, she escalated to salter O2 then BiPAP on 3/11.  She was given lasix with 2L response.  Her CXR had considerable worsening of consolidative process on the right + effusion.  She was lethargic.  ABG assessed showed respiratory alkalosis, hypoxia.   PCCM consulted for evaluation.   Pertinent  Medical History  Arthritis  Depression  Renal Stones HLD  HTN  PVC's DM II - on insulin  Osteoporosis  Significant Hospital Events: Including procedures, antibiotic start and stop dates in addition to other pertinent events   3/8 Admit  3/11 PCCM consulted for pulmonary evaluation > BiPAP, hypoxic  Interim History / Subjective:  Afebrile  RRT RN at bedside. Pt on BiPAP.    Objective   Blood pressure (!) 140/65, pulse 67, temperature 97.9 F (36.6 C),  temperature source Axillary, resp. rate (!) 24, SpO2 95 %.    Vent Mode: BIPAP FiO2 (%):  [40 %-60 %] 50 % Set Rate:  [12 bmp] 12 bmp PEEP:  [5 cmH20] 5 cmH20 Pressure Support:  [5 cmH20] 5 cmH20   Intake/Output Summary (Last 24 hours) at 11/06/2022 1147 Last data filed at 11/06/2022 1056 Gross per 24 hour  Intake 5015.71 ml  Output 875 ml  Net 4140.71 ml   There were no vitals filed for this visit.  Examination: General: elderly adult female lying in bed in NAD HENT: MM pink/dry, bipap mask in place, anicteric  Lungs: non-labored at rest, on BiPAP, diminished on right Cardiovascular: s1s2 RRR, no m/r/g Abdomen: non-distended, soft, bsx4 active  Extremities: warm/dry, pale, trace LE edema  Neuro: awakens to voice, speech clear, follows commands    Resolved Hospital Problem list      Assessment & Plan:   Acute Hypoxic Respiratory Failure in the setting of CAP  Significant right sided infiltrate on admission, progressive on plan film. Strep antigen negative. Progressive O2 needs, on BiPAP -now BiPAP support  -wean O2 for sats >90%  -continue abx  -aspiration precautions  -consider swallow evaluation once off bipap  -lasix per [facility], follow output > noted 2L since administration  -ABG reviewed, consider transition to heated high flow O2  -follow up legionella antigen -pulmonary hygiene -IS, mobilize -follow intermittent CXR  -noted DNR  Best Practice (right click and "Reselect all SmartList Selections" daily)  Per [facility]   Labs   CBC:  Recent Labs  Lab 11/23/2022 1609 11/26/2022 2357 11/04/22 0251 11/05/22 0546  WBC 12.1* 10.4 11.0* 20.4*  NEUTROABS 9.4*  --  9.4*  --   HGB 11.4* 11.1* 11.2* 10.9*  HCT 32.3* 33.5* 32.1* 32.7*  MCV 81.6 84.8 80.7 83.8  PLT 277 235 257 [id number]    Basic Metabolic Panel: Recent Labs  Lab 11/10/2022 1609 11/02/2022 2045 11/06/2022 2357 11/04/22 0251 11/04/22 0813 11/05/22 0546  NA 119* 119*  --  122* 123* 126*  K 2.9* 3.0*  --  3.6 4.0  3.5  CL 85* 85*  --  93* 96* 98  CO2 20* 23  --  19* 16* 18*  GLUCOSE 225* 204*  --  240* 272* 137*  BUN 24* 20  --  '17 16 15  '$ CREATININE 1.08* 0.90 0.89 0.76 0.82 0.67  CALCIUM 7.8* 7.7*  --  7.4* 7.5* 7.5*  MG  --  2.0  --   --   --   --    GFR: Estimated Creatinine Clearance: 58 mL/min (by C-G formula based on SCr of 0.67 mg/dL). Recent Labs  Lab 11/20/2022 1609 11/12/2022 2357 11/04/22 0251 11/05/22 0546  WBC 12.1* 10.4 11.0* 20.4*    Liver Function Tests: Recent Labs  Lab 11/05/2022 1609  AST 38  ALT 35  ALKPHOS 60  BILITOT 0.8  PROT 5.7*  ALBUMIN 2.9*   No results for input(s): "LIPASE", "AMYLASE" in the last 168 hours. No results for input(s): "AMMONIA" in the last 168 hours.  ABG    Component Value Date/Time   PHART 7.49 (H) 11/06/2022 1116   PCO2ART 28 (L) 11/06/2022 1116   PO2ART 57 (L) 11/06/2022 1116   HCO3 20.9 11/06/2022 1116   ACIDBASEDEF 1.4 11/06/2022 1116   O2SAT 91.9 11/06/2022 1116     Coagulation Profile: No results for input(s): "INR", "PROTIME" in the last 168 hours.  Cardiac Enzymes: No results for input(s): "CKTOTAL", "CKMB", "CKMBINDEX", "TROPONINI" in the last 168 hours.  HbA1C: Hemoglobin A1C  Date/Time Value Ref Range Status  07/27/2022 10:51 AM 8.2 (A) 4.0 - 5.6 % Final    CBG: Recent Labs  Lab 11/05/22 0745 11/05/22 1156 11/05/22 1621 11/05/22 2052 11/06/22 0833  GLUCAP 133* 216* 170* 199* 113*    Review of Systems:   Unable to complete with patient due to bipap.   Past Medical History:  She,  has a past medical history of Arthritis, Depression, History of kidney stones, Hyperlipidemia associated with type 2 diabetes mellitus ([place]), Hypertension, Osteoporosis, PVC's (premature ventricular contractions), and Type II diabetes mellitus ([place]).   Surgical History:   Past Surgical History:  Procedure Laterality Date   CATARACT EXTRACTION Right 02/2021   CATARACT EXTRACTION Left 03/2021   COLONOSCOPY WITH  ESOPHAGOGASTRODUODENOSCOPY (EGD)     ORIF ELBOW FRACTURE Right 06/21/2021   Procedure: OPEN REDUCTION INTERNAL FIXATION (ORIF) ELBOW/OLECRANON FRACTURE;  Surgeon: Roseanne Kaufman, MD;  Location: [facility];  Service: Orthopedics;  Laterality: Right;   ULNAR NERVE TRANSPOSITION Right 06/21/2021   Procedure: ULNAR NERVE DECOMPRESSION and BURSECTOMY;  Surgeon: Roseanne Kaufman, MD;  Location: [place];  Service: Orthopedics;  Laterality: Right;     Social History:   reports that she has never smoked. She has never used smokeless tobacco. She reports that she does not drink alcohol.   Family History:  Her family history includes Colon cancer in her maternal grandfather. There is no history of Colon polyps.   Allergies Allergies  Allergen Reactions   Sulfa Antibiotics Anaphylaxis  Sulfites Anaphylaxis    Itching, diarrhea, headaches    Enalapril Maleate     Unknown reaction   Other     Tape BUT tolerates paper tape   Quinapril     Unknown reaction   Rosuvastatin     Unknown reaction   Ampicillin Itching and Rash     Home Medications  Prior to Admission medications   Medication Sig Start Date End Date Taking? Authorizing Provider  aspirin EC 81 MG tablet Take 81 mg by mouth daily. Swallow whole.    [provider]  atenolol (TENORMIN) 100 MG tablet TAKE ONE TABLET BY MOUTH DAILY 10/30/22   Hoyt Koch, MD  blood glucose meter kit and supplies by Other route as directed. Dispense based on patient and insurance preference. Use up to four times daily as directed. (FOR ICD-10 E10.9, E11.9).    [provider]  budesonide (ENTOCORT EC) 3 MG 24 hr capsule Take 1 capsule (3 mg total) by mouth daily. 09/15/22   Thornton Park, MD  buPROPion (WELLBUTRIN XL) 150 MG 24 hr tablet Take 2 tablets (300 mg total) by mouth every morning. 03/08/22   Hoyt Koch, MD  butalbital-aspirin-caffeine [facility]) [phone] MG capsule TAKE ONE CAPSULE EVERY 6 HOURS AS NEEDED FOR  MIGRAINE 07/10/22   Hoyt Koch, MD  chlorthalidone (HYGROTON) 25 MG tablet TAKE ONE TABLET BY MOUTH DAILY 02/27/22   Buford Dresser, MD  Continuous Blood Gluc Sensor (DEXCOM G7 SENSOR) MISC 1 Device by Does not apply route as directed. 07/27/22   Shamleffer, Melanie Crazier, MD  Continuous Blood Gluc Sensor (FREESTYLE LIBRE 14 DAY SENSOR) MISC Use as instructed, change every 14 days 08/17/22   Shamleffer, Melanie Crazier, MD  Cyanocobalamin (B-12) 2500 MCG TABS Take 2,500 mcg by mouth daily.    [provider]  diltiazem (CARDIZEM CD) 240 MG 24 hr capsule Take 240 mg by mouth daily.    [provider]  doxycycline (PERIOSTAT) 20 MG tablet Take 40 mg by mouth daily as needed (rosacea flare). Patient not taking: Reported on 11/18/2022 08/17/14   [provider]  ezetimibe (ZETIA) 10 MG tablet TAKE ONE TABLET BY MOUTH ONCE DAILY 07/24/22   Hoyt Koch, MD  famotidine (PEPCID) 40 MG tablet TAKE ONE TABLET BY MOUTH IN THE MORNING 02/27/22   Buford Dresser, MD  insulin aspart (NOVOLOG FLEXPEN) 100 UNIT/ML FlexPen Max 50 units daily Novolog insulin to carb ratio 1 to 8 with each meal 07/28/22   Shamleffer, Melanie Crazier, MD  insulin glargine (LANTUS SOLOSTAR) 100 UNIT/ML Solostar Pen Inject 30 Units into the skin daily. 07/27/22   Shamleffer, Melanie Crazier, MD  Insulin Pen Needle 29G X 5MM MISC 1 Device by Does not apply route in the morning, at noon, in the evening, and at bedtime. 07/27/22   Shamleffer, Melanie Crazier, MD  Magnesium 400 MG CAPS Take 400 mg by mouth daily.    [provider]  Melatonin 5 MG CAPS Take 5 mg by mouth at bedtime as needed (sleep).    [provider]  METAMUCIL FIBER PO Take 6 capsules by mouth daily.    [provider]  metroNIDAZOLE (METROGEL) 1 % gel Apply topically. 05/29/22   [provider]  Multiple Vitamin (MULTIVITAMIN ADULT PO) Take 1 tablet by mouth daily.    [provider]  olmesartan (BENICAR) 40 MG tablet TAKE ONE TABLET BY MOUTH ONCE DAILY 10/30/22   Hoyt Koch, MD  omega-3 acid ethyl esters (  LOVAZA) 1 g capsule TAKE TWO CAPSULES BY MOUTH TWICE DAILY. 12/26/21   Hoyt Koch, MD  ondansetron (ZOFRAN) 4 MG tablet Take 1 tablet (4 mg total) by mouth every 8 (eight) hours as needed for nausea or vomiting. 12/08/21   Hoyt Koch, MD  pravastatin (PRAVACHOL) 40 MG tablet TAKE ONE TABLET ONCE DAILY 02/13/22   Buford Dresser, MD  Pyridoxine HCl (B-6) 100 MG TABS Take 100 mg by mouth daily.    [provider]  rOPINIRole (REQUIP) 1 MG tablet Take 1 tablet (1 mg total) by mouth at bedtime. 06/30/22   Hoyt Koch, MD  sucralfate (CARAFATE) 1 g tablet Take 1 tablet by mouth two times a day. 07/03/22   Thornton Park, MD  TIADYLT ER 240 MG 24 hr capsule Take 1 capsule (240 mg total) by mouth daily. 07/24/22   Buford Dresser, MD  triamcinolone (KENALOG) 0.1 % Apply 1 application topically daily.    [provider]  UNABLE TO FIND Med Name: [place]    [provider]     Critical care time: n/a    Noe Gens, MSN, APRN, NP-C, AGACNP-BC [facility] 11/06/2022, 11:47 AM   Please see [email or web address] for pager details.   From 7A-7P if no response, please call [phone] After hours, please call ELink [phone]

## 2022-11-06 NOTE — Progress Notes (Signed)
MEWS Progress Note  Patient Details Name: Sarah Key MRN: [id number] DOB: 08-Dec-1945 Today's Date: 11/06/2022   MEWS Flowsheet Documentation:  Assess: MEWS Score Temp: 97.6 F (36.4 C) BP: (!) 169/82 MAP (mmHg): 106 Pulse Rate: 84 ECG Heart Rate: 85 Resp: (!) 28 Level of Consciousness: Alert SpO2: (!) 87 % O2 Device: High Flow Nasal Cannula Patient Activity (if Appropriate): In bed O2 Flow Rate (L/min): 10 L/min FiO2 (%): 40 % Assess: MEWS Score MEWS Temp: 0 MEWS Systolic: 0 MEWS Pulse: 0 MEWS RR: 2 MEWS LOC: 0 MEWS Score: 2 MEWS Score Color: Yellow Assess: SIRS CRITERIA SIRS Temperature : 0 SIRS Respirations : 1 SIRS Pulse: 0 SIRS WBC: 0 SIRS Score Sum : 1 SIRS Temperature : 0 SIRS Pulse: 0 SIRS Respirations : 1 SIRS WBC: 0 SIRS Score Sum : 1 Assess: if the MEWS score is Yellow or Red Were vital signs taken at a resting state?: Yes Focused Assessment: Change from prior assessment (see assessment flowsheet) Does the patient meet 2 or more of the SIRS criteria?: No Does the patient have a confirmed or suspected source of infection?: Yes Provider and Rapid Response Notified?: Yes MEWS guidelines implemented : No, other (Comment) Provider Notification Provider Name/Title: Dr. Avon Gully Date Provider Notified: 11/06/22 Time Provider Notified: [id number] Method of Notification:  (IM) Notification Reason:  (MEWS Yellow) Test performed and critical result: Sodium 119 Date Critical Result Received: 11/20/2022 Time Critical Result Received: 2200 Provider response:  (anticipated) Date of Provider Response: 11/06/22 Time of Provider Response: 0321 Notify: Rapid Response Name of Rapid Response RN Notified: t Date Rapid Response Notified: 11/06/22 Time Rapid Response Notified: [id number]      Sarah Key 11/06/2022, 8:30 AM

## 2022-11-06 NOTE — Significant Event (Signed)
Rapid Response Event Note   Reason for Call :  Initial yellow mews at 0830 Rounding on patient, O2 noted in upper 70s  Initial Focused Assessment:  Entered room to find pt lying on side, face red, use of accessory muscles. Patient repositioned to sitting up in bed. A&Ox4 though answers in short phrases. C/o SOB, and tired, continues to close eyes during assessment. Skin warm and dry. Lungs with upper wheezes and bases very diminished. Recently had '40mg'$  IV lasix with ~1L UOP.  VS 172/71 (99) HR 76 RR 33 O2 78% 10L HFNC Salter  Interventions:  Titrate O2, Bipap Stop IVF ABG MD to bedside CCM consult  140/65 (87) HR 67 RR 28 O2 95% Bipap  Plan of Care:  Continue BiPAP, discuss plan of care with significant other, may need ICU care. Call back for further needs.  Event Summary:  MD Notified: Reola Mosher MD Call Time:  Arrival [id number] End Time: 1130  Newman Nickels, RN

## 2022-11-06 NOTE — Progress Notes (Signed)
PROGRESS NOTE    Sarah Key  [id number] DOB: 24-Jan-1946 DOA: 10/30/2022 PCP: Hoyt Koch, MD   Brief Narrative:  Patient is a 77 year old female with past medical history HLD, DM, HTN, RLS, and depression.  She presents with 1 week of severe bilateral lower rib pain and lethargy.  Imaging in the ED remarkable for large right upper lobe infiltrates consistent with pneumonia.  Assessment & Plan:   Principal Problem:   CAP (community acquired pneumonia) Active Problems:   Diabetes mellitus ([place])   Hypertension   Colitis   RLS (restless legs syndrome)   Hyponatremia   Hypokalemia   Hyperlipidemia   Cirrhosis (HCC)  Acute hypoxic respiratory failure secondary to commune acquired pneumonia -Patient meets sepsis criteria given leukocytosis tachypnea with source of pneumonia -CT confirms right upper lobe infiltrates -Patient's respiratory status continues to decline, required BiPAP overnight. -Repeat chest x-ray shows diffuse bilateral opacifications concerning for edema, previous imaging showed only right upper lobe involvement. -Given progression of patient's disease and oxygen requirements pulmonology/PCCM was sidelined for their assistance -Continue BiPAP as needed currently tolerating 5/5 with markedly improved oxygen levels, ABG shows moderate hypoxia without hypercarbia -Continue ceftriaxone and azithromycin -Blood cultures pending -Radiology recommending repeat CT postinfection to rule out any further underlying process   Hypovolemic hyponatremia Secondary to poor p.o. intake, improved with IV fluids - decreased to 20cc/hr 118 at intake, currently 126 Possibly exacerbated by chlorthalidone Continue to encourage PO intake  PVC -Continue diltiazem  Hypokalemia -Resolved   Hypertension -Continue diltiazem, atenolol and olmesartan -Chlorthalidone on hold   Hyperlipidemia -Pravastatin, Zetia resumed   Diabetes mellitus, uncontrolled with  hyperglycemia -Lantus resumed, sliding scale insulin and hypoglycemic protocol ongoing   Chronic colitis - UC vs lymphocytic colitis -Continue budesonide 3 mg daily -Continue Pepcid, sucralfate and Entocort  DVT prophylaxis: Lovenox Code Status: Full Family Communication: Husband at bedside.  Status is: Inpatient  Dispo: The patient is from: Home              Anticipated d/c is to: Home              Anticipated d/c date is: 24 to 48 hours              Patient currently not medically stable for discharge  Consultants:  None  Procedures:  None  Antimicrobials:  Azithromycin, ceftriaxone x 5 days  Subjective: Worsening respiratory status overnight requiring increased oxygen levels and subsequently requiring BiPAP placement, chest x-ray overnight shows worsening bilateral airspace disease.  Patient states she feels "much worse" without other specifics during ROS.  Objective: Vitals:   11/06/22 0000 11/06/22 0300 11/06/22 0336 11/06/22 0750  BP: (!) 118/54  (!) 158/60   Pulse: (!) 56 (!) 57 71   Resp: (!) 21 20 (!) 24   Temp: (!) 97.5 F (36.4 C)  97.6 F (36.4 C)   TempSrc: Axillary  Axillary   SpO2: 96% 96% 92% 92%    Intake/Output Summary (Last 24 hours) at 11/06/2022 0810 Last data filed at 11/05/2022 1649 Gross per 24 hour  Intake 3564.65 ml  Output --  Net 3564.65 ml    There were no vitals filed for this visit.  Examination:  General:  Pleasantly resting in bed, No acute distress. HEENT:  Normocephalic atraumatic.  Sclerae nonicteric, noninjected.  Extraocular movements intact bilaterally. Neck:  Without mass or deformity.  Trachea is midline. Lungs: Diffuse R rhonchi/basilar rales. L sided end expiratory wheeze. Heart:  Regular rate and rhythm.  Without murmurs, rubs, or gallops. Abdomen:  Soft, nontender, nondistended.  Without guarding or rebound. Extremities: Without cyanosis, clubbing, edema, or obvious deformity. Vascular:  Dorsalis pedis and  posterior tibial pulses palpable bilaterally. Skin:  Warm and dry, no erythema, no ulcerations.  Data Reviewed: I have personally reviewed following labs and imaging studies  CBC: Recent Labs  Lab 10/29/2022 1609 11/07/2022 2357 11/04/22 0251 11/05/22 0546  WBC 12.1* 10.4 11.0* 20.4*  NEUTROABS 9.4*  --  9.4*  --   HGB 11.4* 11.1* 11.2* 10.9*  HCT 32.3* 33.5* 32.1* 32.7*  MCV 81.6 84.8 80.7 83.8  PLT 277 235 257 [id number]    Basic Metabolic Panel: Recent Labs  Lab 11/16/2022 1609 11/21/2022 2045 11/25/2022 2357 11/04/22 0251 11/04/22 0813 11/05/22 0546  NA 119* 119*  --  122* 123* 126*  K 2.9* 3.0*  --  3.6 4.0 3.5  CL 85* 85*  --  93* 96* 98  CO2 20* 23  --  19* 16* 18*  GLUCOSE 225* 204*  --  240* 272* 137*  BUN 24* 20  --  '17 16 15  '$ CREATININE 1.08* 0.90 0.89 0.76 0.82 0.67  CALCIUM 7.8* 7.7*  --  7.4* 7.5* 7.5*  MG  --  2.0  --   --   --   --     GFR: Estimated Creatinine Clearance: 58 mL/min (by C-G formula based on SCr of 0.67 mg/dL).  Liver Function Tests: Recent Labs  Lab 11/17/2022 1609  AST 38  ALT 35  ALKPHOS 60  BILITOT 0.8  PROT 5.7*  ALBUMIN 2.9*    CBG: Recent Labs  Lab 11/04/22 2013 11/05/22 0745 11/05/22 1156 11/05/22 1621 11/05/22 2052  GLUCAP 206* 133* 216* 170* 199*    Thyroid Function Tests: Recent Labs    10/31/2022 2045  TSH 0.414    Recent Results (from the past 240 hour(s))  Resp panel by RT-PCR (RSV, Flu A&B, Covid) Anterior Nasal Swab     Status: None   Collection Time: 11/20/2022  4:17 PM   Specimen: Anterior Nasal Swab  Result Value Ref Range Status   SARS Coronavirus 2 by RT PCR NEGATIVE NEGATIVE Final   Influenza A by PCR NEGATIVE NEGATIVE Final   Influenza B by PCR NEGATIVE NEGATIVE Final    Comment: (NOTE) The Xpert Xpress SARS-CoV-2/FLU/RSV plus assay is intended as an aid in the diagnosis of influenza from Nasopharyngeal swab specimens and should not be used as a sole basis for treatment. Nasal washings and aspirates  are unacceptable for Xpert Xpress SARS-CoV-2/FLU/RSV testing.  Fact Sheet for Patients: EntrepreneurPulse.com.au  Fact Sheet for Healthcare Providers: IncredibleEmployment.be  This test is not yet approved or cleared by the [place] FDA and has been authorized for detection and/or diagnosis of SARS-CoV-2 by FDA under an Emergency Use Authorization (EUA). This EUA will remain in effect (meaning this test can be used) for the duration of the COVID-19 declaration under Section 564(b)(1) of the Act, 21 U.S.C. section 360bbb-3(b)(1), unless the authorization is terminated or revoked.     Resp Syncytial Virus by PCR NEGATIVE NEGATIVE Final    Comment: (NOTE) Fact Sheet for Patients: EntrepreneurPulse.com.au  Fact Sheet for Healthcare Providers: IncredibleEmployment.be  This test is not yet approved or cleared by the [place] FDA and has been authorized for detection and/or diagnosis of SARS-CoV-2 by FDA under an Emergency Use Authorization (EUA). This EUA will remain in effect (meaning this test can be used) for the duration of the COVID-19 declaration  under Section 564(b)(1) of the Act, 21 U.S.C. section 360bbb-3(b)(1), unless the authorization is terminated or revoked.  Performed at [facility], [place] [address] [place], [place] [address]   Blood culture (routine x 2)     Status: None (Preliminary result)   Collection Time: 11/07/2022  8:13 PM   Specimen: BLOOD LEFT HAND  Result Value Ref Range Status   Specimen Description BLOOD LEFT HAND  Final   Special Requests   Final    BOTTLES DRAWN AEROBIC AND ANAEROBIC Blood Culture results may not be optimal due to an inadequate volume of blood received in culture bottles   Culture   Final    NO GROWTH 2 DAYS Performed at [facility], [place] [address], [place], [place] [address]    Report Status PENDING  Incomplete  Blood culture (routine x  2)     Status: None (Preliminary result)   Collection Time: 11/23/2022  8:19 PM   Specimen: BLOOD RIGHT HAND  Result Value Ref Range Status   Specimen Description BLOOD RIGHT HAND  Final   Special Requests   Final    BOTTLES DRAWN AEROBIC AND ANAEROBIC Blood Culture results may not be optimal due to an inadequate volume of blood received in culture bottles   Culture   Final    NO GROWTH 2 DAYS Performed at [facility], [place] [address] [place], [place] [address]    Report Status PENDING  Incomplete         Radiology Studies: DG CHEST PORT 1 VIEW  Result Date: 11/06/2022 CLINICAL DATA:  [id number].  Labored breathing. EXAM: PORTABLE CHEST 1 VIEW COMPARISON:  CTA chest and portable chest both 11/26/2022 FINDINGS: 4:41 a.m. There is interval worsening of extensive airspace disease in the right lung, with patchy dense consolidation having worsened in the right upper lobe distribution and extended into the lower lung field. Only a portion of the right apex remains aerated. On the left there is worsening perihilar airspace disease in the mid to lower lung field consistent with development of pneumonia on the left. The cardiac size is stable. The mediastinum is normally outlined with moderate calcific plaque in the aorta. There are likely small pleural effusions but this is difficult to evaluate due to the density of the airspace disease. The left upper lung field remains clear. In all other respects no further changes. IMPRESSION: 1. Interval worsening of airspace disease in the right lung and left mid to lower lung field consistent with worsening pneumonia. 2. Probable small pleural effusions. 3. Aortic atherosclerosis. 4. No further changes. Electronically Signed   By: Telford Nab M.D.   On: 11/06/2022 06:54    Scheduled Meds:  albuterol  2.5 mg Nebulization BID   aspirin EC  81 mg Oral Daily   atenolol  100 mg Oral Daily   azithromycin  500 mg Oral Daily   budesonide  3 mg Oral Daily    buPROPion  300 mg Oral Daily   cyanocobalamin  2,500 mcg Oral Daily   diltiazem  240 mg Oral Daily   enoxaparin (LOVENOX) injection  40 mg Subcutaneous Q24H   ezetimibe  10 mg Oral Daily   famotidine  40 mg Oral q morning   insulin aspart  0-15 Units Subcutaneous TID WC   insulin aspart  0-5 Units Subcutaneous QHS   insulin glargine-yfgn  30 Units Subcutaneous QHS   irbesartan  150 mg Oral Daily   pravastatin  40 mg Oral Daily  pyridOXINE  100 mg Oral Daily   sucralfate  1 g Oral BID   Continuous Infusions:  0.9 % NaCl with KCl 20 mEq / L 100 mL/hr at 11/05/22 2319   cefTRIAXone (ROCEPHIN)  IV 2 g (11/05/22 2043)     LOS: 3 days   Time spent: 56mn  Alissandra Geoffroy C Tenesha Garza, DO [facility]  If 7PM-7AM, please contact night-coverage [email or web address]  11/06/2022, 8:10 AM

## 2022-11-06 NOTE — Progress Notes (Signed)
Pt requested a break of BiPAP due to being claustrophobic. RT placed pt on NRB and [place] at 15L. Pt SPO2 currently at 93% on both. Pt instructed once given meds to calm down Rt may have to put pt back on BiPAP due to increased WOB.

## 2022-11-07 DIAGNOSIS — J189 Pneumonia, unspecified organism: Secondary | ICD-10-CM | POA: Diagnosis not present

## 2022-11-07 LAB — BASIC METABOLIC PANEL
Anion gap: 14 (ref 5–15)
BUN: 21 mg/dL (ref 8–23)
CO2: 21 mmol/L — ABNORMAL LOW (ref 22–32)
Calcium: 8 mg/dL — ABNORMAL LOW (ref 8.9–10.3)
Chloride: 98 mmol/L (ref 98–111)
Creatinine, Ser: 0.61 mg/dL (ref 0.44–1.00)
GFR, Estimated: >60 mL/min (ref >60)
Glucose, Bld: 185 mg/dL — ABNORMAL HIGH (ref 70–99)
Potassium: 3.4 mmol/L — ABNORMAL LOW (ref 3.5–5.1)
Sodium: 133 mmol/L — ABNORMAL LOW (ref 135–145)

## 2022-11-07 LAB — CBC
HCT: 34 % — ABNORMAL LOW (ref 36.0–46.0)
Hemoglobin: 11.7 g/dL — ABNORMAL LOW (ref 12.0–15.0)
MCH: 28.4 pg (ref 26.0–34.0)
MCHC: 34.4 g/dL (ref 30.0–36.0)
MCV: 82.5 fL (ref 80.0–100.0)
Platelets: 320 10*3/uL (ref 150–400)
RBC: 4.12 MIL/uL (ref 3.87–5.11)
RDW: 13.6 % (ref 11.5–15.5)
WBC: 20.6 10*3/uL — ABNORMAL HIGH (ref 4.0–10.5)
nRBC: 0 % (ref 0.0–0.2)

## 2022-11-07 LAB — GLUCOSE, CAPILLARY
Glucose-Capillary: 145 mg/dL — ABNORMAL HIGH (ref 70–99)
Glucose-Capillary: 144 mg/dL — ABNORMAL HIGH (ref 70–99)
Glucose-Capillary: 146 mg/dL — ABNORMAL HIGH (ref 70–99)
Glucose-Capillary: 159 mg/dL — ABNORMAL HIGH (ref 70–99)

## 2022-11-07 LAB — LEGIONELLA PNEUMOPHILA SEROGP 1 UR AG: L. pneumophila Serogp 1 Ur Ag: NEGATIVE

## 2022-11-07 MED ORDER — DIAZEPAM 5 MG/ML IJ SOLN
2.5000 mg | Freq: Three times a day (TID) | INTRAMUSCULAR | Status: DC | PRN
  Administered 2022-11-07 – 2022-11-08 (×3): 2.5 mg via INTRAVENOUS
  Filled 2022-11-07 (×3): qty 2

## 2022-11-07 NOTE — Progress Notes (Signed)
NAME:  Sarah Key, MRN:  [id number], DOB:  [birth year], LOS: 4 ADMISSION DATE:  11/08/2022, CONSULTATION DATE:  11/06/22 REFERRING MD:  Dr. Avon Gully, CHIEF COMPLAINT: SOB   History of Present Illness:  77 y/o F who presented to her PCP on 3/8 with reports of frequent falls, dizziness.   On presentation to her PCP, she was documented to be ill appearing, short of breath, pale, & confused. She had apparently hit her head with one of her multiple recent falls. She was referred to the ER via EMS. On arrival to the ER, she also reported bilateral rib pain with inspiration and cough with green sputum production. Work up notable for large consolidative opacity on the right. 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 CT head imaging negative for acute process. She was admitted by [facility], treated as CAP. COVID/Flu A&B/RSV screening negative. Her initial O2 needs were 2L but over her course of admission, she escalated to salter O2 then BiPAP on 3/11.  She was given lasix with 2L response.  Her CXR had considerable worsening of consolidative process on the right + effusion.  She was lethargic.  ABG assessed showed respiratory alkalosis, hypoxia.   PCCM consulted for evaluation.   Pertinent  Medical History  Arthritis  Depression  Renal Stones HLD  HTN  PVC's DM II - on insulin  Osteoporosis  Significant Hospital Events: Including procedures, antibiotic start and stop dates in addition to other pertinent events   3/8 Admit  3/11 PCCM consulted for pulmonary evaluation > BiPAP, hypoxic  Interim History / Subjective:  Afebrile  RRT RN at bedside. Pt on BiPAP.    Objective   Blood pressure (!) 146/53, pulse 64, temperature 97.9 F (36.6 C),  temperature source Axillary, resp. rate (!) 30, SpO2 (!) 89 %.    Vent Mode: BIPAP FiO2 (%):  [50 %-90 %] 50 % Set Rate:  [16 bmp] 16 bmp PEEP:  [7 cmH20] 7 cmH20   Intake/Output Summary (Last 24 hours) at 11/07/2022 1117 Last data filed at 11/07/2022 0600 Gross per 24 hour  Intake 418.66 ml  Output 1000 ml  Net -581.34 ml   There were no vitals filed for this visit.  Examination: General: Frail elderly female currently on BiPAP for 18 hours will trial off HEENT: MM pink/moist Neuro: Responds to commands follows commands CV: Heart sounds are distant PULM: Poor air movement transition to 15 L salter with O2 saturation is 93%   GI: soft, bsx4 active  GU: Voids Extremities: warm/dry, 2+ edema  Skin: no rashes or lesions     Resolved Hospital Problem list      Assessment & Plan:   Acute Hypoxic Respiratory Failure in the setting of CAP  Significant right sided infiltrate on admission, progressive on plan film. Strep antigen negative. Progressive O2 needs, on BiPAP.  She is a DNR Will trial off of BiPAP O2 to keep sats greater than 88% Continue antibiotics Diuresis as tolerated She may be in a rapid decline at this point Follow intermittent chest x-ray Follow microbial data  Best Practice (right click and "Reselect all SmartList Selections" daily)  Per [facility]   Labs   CBC: Recent Labs  Lab 11/08/2022 1609 11/12/2022 2357 11/04/22 0251 11/05/22 0546 11/07/22 0550  WBC 12.1*  10.4 11.0* 20.4* 20.6*  NEUTROABS 9.4*  --  9.4*  --   --   HGB 11.4* 11.1* 11.2* 10.9* 11.7*  HCT 32.3* 33.5* 32.1* 32.7* 34.0*  MCV 81.6 84.8 80.7 83.8 82.5  PLT 277 235 257 325 [id number]    Basic Metabolic Panel: Recent Labs  Lab 11/26/2022 2045 11/25/2022 2357 11/04/22 0251 11/04/22 0813 11/05/22 0546 11/07/22 0550  NA 119*  --  122* 123* 126* 133*  K 3.0*  --  3.6 4.0 3.5 3.4*  CL 85*  --  93* 96* 98 98  CO2 23  --  19* 16* 18* 21*  GLUCOSE 204*  --  240* 272* 137* 185*  BUN 20  --  '17  16 15 21  '$ CREATININE 0.90 0.89 0.76 0.82 0.67 0.61  CALCIUM 7.7*  --  7.4* 7.5* 7.5* 8.0*  MG 2.0  --   --   --   --   --    GFR: Estimated Creatinine Clearance: 58 mL/min (by C-G formula based on SCr of 0.61 mg/dL). Recent Labs  Lab 11/08/2022 2357 11/04/22 0251 11/05/22 0546 11/07/22 0550  WBC 10.4 11.0* 20.4* 20.6*    Liver Function Tests: Recent Labs  Lab 11/07/2022 1609  AST 38  ALT 35  ALKPHOS 60  BILITOT 0.8  PROT 5.7*  ALBUMIN 2.9*   No results for input(s): "LIPASE", "AMYLASE" in the last 168 hours. No results for input(s): "AMMONIA" in the last 168 hours.  ABG    Component Value Date/Time   PHART 7.49 (H) 11/06/2022 1116   PCO2ART 28 (L) 11/06/2022 1116   PO2ART 57 (L) 11/06/2022 1116   HCO3 20.9 11/06/2022 1116   ACIDBASEDEF 1.4 11/06/2022 1116   O2SAT 91.9 11/06/2022 1116     Coagulation Profile: No results for input(s): "INR", "PROTIME" in the last 168 hours.  Cardiac Enzymes: No results for input(s): "CKTOTAL", "CKMB", "CKMBINDEX", "TROPONINI" in the last 168 hours.  HbA1C: Hemoglobin A1C  Date/Time Value Ref Range Status  07/27/2022 10:51 AM 8.2 (A) 4.0 - 5.6 % Final    CBG: Recent Labs  Lab 11/06/22 0833 11/06/22 1314 11/06/22 1540 11/06/22 2108 11/07/22 0740  GLUCAP 113* 228* 217* 170* 145*      Critical care time: n/a    Richardson Landry Fendi Meinhardt ACNP Acute Care Nurse Practitioner [place] Please consult [place] 11/07/2022, 11:18 AM

## 2022-11-07 NOTE — Progress Notes (Signed)
RT called to pts room due to pt desattng in the high 70's to low 80's with NRB and Salter both at 15L and increased WOB. RT placed pt on BiPAP. Pt on 80% currently SPO2 93%.Rt will continue to monitor.

## 2022-11-07 NOTE — Progress Notes (Signed)
PROGRESS NOTE    Sarah Key  [id number] DOB: Aug 23, 1946 DOA: 11/14/2022 PCP: Hoyt Koch, MD   Brief Narrative:  Patient is a 77 year old female with past medical history HLD, DM, HTN, RLS, and depression.  She presents with 1 week of severe bilateral lower rib pain and lethargy.  Imaging in the ED remarkable for large right upper lobe infiltrates consistent with pneumonia.  Assessment & Plan:   Principal Problem:   CAP (community acquired pneumonia) Active Problems:   Diabetes mellitus ([place])   Hypertension   Colitis   RLS (restless legs syndrome)   Hyponatremia   Hypokalemia   Hyperlipidemia   Cirrhosis (HCC)  Acute hypoxic respiratory failure secondary to commune acquired pneumonia -Patient meets sepsis criteria given leukocytosis tachypnea with source of pneumonia -CT confirms right upper lobe infiltrates -follow-up chest x-ray shows diffuse bilateral opacities -Respiratory status stabilizing, continues to require BiPAP and high flow nasal cannula but without escalation. -PCCM consulted for assistance given patient's somewhat rapid and progressive decline yesterday, appreciate their insight -Continue azithromycin, ceftriaxone, steroids -Blood cultures remain negative -Radiology recommending repeat CT postinfection to rule out any further underlying process   Acute metabolic encephalopathy  -Likely secondary to prior hypoxia and presumed hypercarbia, resolved with improved respiratory status  Hypovolemic hyponatremia, resolving -Secondary to poor p.o. 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  Status is: Inpatient  Dispo: The patient is from: Home              Anticipated d/c is to: Home              Anticipated d/c date is: 24 to 48 hours              Patient currently not medically stable for discharge  Consultants:  None  Procedures:  None  Antimicrobials:  Azithromycin, ceftriaxone x 5 days  Subjective: Respiratory status stabilizing, patient indicates she feels "somewhat better" than yesterday but certainly not back to baseline.  Hopeful to remove BiPAP mask shortly for breaks and meals.  Objective: Vitals:   11/07/22 0300 11/07/22 0320 11/07/22 0741 11/07/22 0746  BP: (!) 131/56  (!) 141/53   Pulse: (!) 57 62 (!) 57   Resp: '14 20 20   '$ Temp: 98 F (36.7 C)  97.9 F (36.6 C)   TempSrc: Axillary  Axillary   SpO2: 100% 95% 93% 94%    Intake/Output Summary (Last 24 hours) at 11/07/2022 0753 Last data filed at 11/07/2022 0600 Gross per 24 hour  Intake 418.66 ml  Output 1875 ml  Net -1456.34 ml    There were no vitals filed for this visit.  Examination:  General: Resting comfortably wearing BiPAP HEENT:  Normocephalic atraumatic.  Sclerae nonicteric, noninjected.  Extraocular movements intact bilaterally. Neck:  Without mass or deformity.  Trachea is midline. Lungs: Diffuse R rhonchi/basilar rales. L sided end expiratory wheeze. Heart:  Regular rate and rhythm.  Without  murmurs, rubs, or gallops. Abdomen:  Soft, nontender, nondistended.  Without guarding or rebound. Extremities: Without cyanosis, clubbing, edema, or obvious  deformity. Vascular:  Dorsalis pedis and posterior tibial pulses palpable bilaterally. Skin:  Warm and dry, no erythema, no ulcerations.  Data Reviewed: I have personally reviewed following labs and imaging studies  CBC: Recent Labs  Lab 11/15/2022 1609 11/17/2022 2357 11/04/22 0251 11/05/22 0546 11/07/22 0550  WBC 12.1* 10.4 11.0* 20.4* 20.6*  NEUTROABS 9.4*  --  9.4*  --   --   HGB 11.4* 11.1* 11.2* 10.9* 11.7*  HCT 32.3* 33.5* 32.1* 32.7* 34.0*  MCV 81.6 84.8 80.7 83.8 82.5  PLT 277 235 257 325 [id number]    Basic Metabolic Panel: Recent Labs  Lab 11/20/2022 2045 11/08/2022 2357 11/04/22 0251 11/04/22 0813 11/05/22 0546 11/07/22 0550  NA 119*  --  122* 123* 126* 133*  K 3.0*  --  3.6 4.0 3.5 3.4*  CL 85*  --  93* 96* 98 98  CO2 23  --  19* 16* 18* 21*  GLUCOSE 204*  --  240* 272* 137* 185*  BUN 20  --  '17 16 15 21  '$ CREATININE 0.90 0.89 0.76 0.82 0.67 0.61  CALCIUM 7.7*  --  7.4* 7.5* 7.5* 8.0*  MG 2.0  --   --   --   --   --     GFR: Estimated Creatinine Clearance: 58 mL/min (by C-G formula based on SCr of 0.61 mg/dL).  Liver Function Tests: Recent Labs  Lab 10/30/2022 1609  AST 38  ALT 35  ALKPHOS 60  BILITOT 0.8  PROT 5.7*  ALBUMIN 2.9*    CBG: Recent Labs  Lab 11/06/22 0833 11/06/22 1314 11/06/22 1540 11/06/22 2108 11/07/22 0740  GLUCAP 113* 228* 217* 170* 145*    Thyroid Function Tests: No results for input(s): "TSH", "T4TOTAL", "FREET4", "T3FREE", "THYROIDAB" in the last 72 hours.  Recent Results (from the past 240 hour(s))  Resp panel by RT-PCR (RSV, Flu A&B, Covid) Anterior Nasal Swab     Status: None   Collection Time: 11/21/2022  4:17 PM   Specimen: Anterior Nasal Swab  Result Value Ref Range Status   SARS Coronavirus 2 by RT PCR NEGATIVE NEGATIVE Final   Influenza A by PCR NEGATIVE NEGATIVE Final   Influenza B by PCR NEGATIVE NEGATIVE Final    Comment: (NOTE) The Xpert Xpress SARS-CoV-2/FLU/RSV plus assay is intended as an aid in the  diagnosis of influenza from Nasopharyngeal swab specimens and should not be used as a sole basis for treatment. Nasal washings and aspirates are unacceptable for Xpert Xpress SARS-CoV-2/FLU/RSV testing.  Fact Sheet for Patients: EntrepreneurPulse.com.au  Fact Sheet for Healthcare Providers: IncredibleEmployment.be  This test is not yet approved or cleared by the [place] FDA and has been authorized for detection and/or diagnosis of SARS-CoV-2 by FDA under an Emergency Use Authorization (EUA). This EUA will remain in effect (meaning this test can be used) for the duration of the COVID-19 declaration under Section 564(b)(1) of the Act, 21 U.S.C. section 360bbb-3(b)(1), unless the authorization is terminated or revoked.     Resp Syncytial Virus by PCR NEGATIVE NEGATIVE Final    Comment: (NOTE) Fact Sheet for Patients: EntrepreneurPulse.com.au  Fact Sheet for Healthcare Providers: IncredibleEmployment.be  This test is not yet approved or cleared by the [place] FDA and has been authorized for detection and/or diagnosis of SARS-CoV-2 by FDA under an Emergency Use Authorization (EUA). This EUA will remain in effect (meaning this  test can be used) for the duration of the COVID-19 declaration under Section 564(b)(1) of the Act, 21 U.S.C. section 360bbb-3(b)(1), unless the authorization is terminated or revoked.  Performed at [facility], [place] [address], [place], [place] [address]   Blood culture (routine x 2)     Status: None (Preliminary result)   Collection Time: 11/25/2022  8:13 PM   Specimen: BLOOD LEFT HAND  Result Value Ref Range Status   Specimen Description BLOOD LEFT HAND  Final   Special Requests   Final    BOTTLES DRAWN AEROBIC AND ANAEROBIC Blood Culture results may not be optimal due to an inadequate volume of blood received in culture bottles   Culture   Final    NO GROWTH 3  DAYS Performed at [facility], [place] [address], [place], [place] [address]    Report Status PENDING  Incomplete  Blood culture (routine x 2)     Status: None (Preliminary result)   Collection Time: 11/13/2022  8:19 PM   Specimen: BLOOD RIGHT HAND  Result Value Ref Range Status   Specimen Description BLOOD RIGHT HAND  Final   Special Requests   Final    BOTTLES DRAWN AEROBIC AND ANAEROBIC Blood Culture results may not be optimal due to an inadequate volume of blood received in culture bottles   Culture   Final    NO GROWTH 3 DAYS Performed at [facility], [place] [address] [place],  [address]    Report Status PENDING  Incomplete         Radiology Studies: DG CHEST PORT 1 VIEW  Result Date: 11/06/2022 CLINICAL DATA:  [id number].  Labored breathing. EXAM: PORTABLE CHEST 1 VIEW COMPARISON:  CTA chest and portable chest both 11/01/2022 FINDINGS: 4:41 a.m. There is interval worsening of extensive airspace disease in the right lung, with patchy dense consolidation having worsened in the right upper lobe distribution and extended into the lower lung field. Only a portion of the right apex remains aerated. On the left there is worsening perihilar airspace disease in the mid to lower lung field consistent with development of pneumonia on the left. The cardiac size is stable. The mediastinum is normally outlined with moderate calcific plaque in the aorta. There are likely small pleural effusions but this is difficult to evaluate due to the density of the airspace disease. The left upper lung field remains clear. In all other respects no further changes. IMPRESSION: 1. Interval worsening of airspace disease in the right lung and left mid to lower lung field consistent with worsening pneumonia. 2. Probable small pleural effusions. 3. Aortic atherosclerosis. 4. No further changes. Electronically Signed   By: Telford Nab M.D.   On: 11/06/2022 06:54    Scheduled Meds:  albuterol  2.5 mg  Nebulization BID   aspirin EC  81 mg Oral Daily   atenolol  100 mg Oral Daily   azithromycin  500 mg Oral Daily   budesonide  3 mg Oral Daily   buPROPion  300 mg Oral Daily   cyanocobalamin  2,500 mcg Oral Daily   diltiazem  240 mg Oral Daily   enoxaparin (LOVENOX) injection  40 mg Subcutaneous Q24H   ezetimibe  10 mg Oral Daily   famotidine  40 mg Oral q morning   hydrocortisone sod succinate (SOLU-CORTEF) inj  200 mg Intravenous Daily   insulin aspart  0-15 Units Subcutaneous TID WC   insulin aspart  0-5 Units Subcutaneous QHS  insulin glargine-yfgn  30 Units Subcutaneous QHS   irbesartan  150 mg Oral Daily   pravastatin  40 mg Oral Daily   pyridOXINE  100 mg Oral Daily   sucralfate  1 g Oral BID   Continuous Infusions:  cefTRIAXone (ROCEPHIN)  IV 2 g (11/06/22 1804)     LOS: 4 days   Time spent: 49mn  Gatsby Chismar C Jacolyn Joaquin, DO [facility]  If 7PM-7AM, please contact night-coverage [email or web address]  11/07/2022, 7:53 AM

## 2022-11-07 NOTE — Plan of Care (Signed)

## 2022-11-08 DIAGNOSIS — K529 Noninfective gastroenteritis and colitis, unspecified: Secondary | ICD-10-CM | POA: Diagnosis not present

## 2022-11-08 DIAGNOSIS — E871 Hypo-osmolality and hyponatremia: Secondary | ICD-10-CM | POA: Diagnosis not present

## 2022-11-08 DIAGNOSIS — J9601 Acute respiratory failure with hypoxia: Secondary | ICD-10-CM | POA: Diagnosis not present

## 2022-11-08 DIAGNOSIS — J189 Pneumonia, unspecified organism: Secondary | ICD-10-CM | POA: Diagnosis not present

## 2022-11-08 LAB — GLUCOSE, CAPILLARY
Glucose-Capillary: 107 mg/dL — ABNORMAL HIGH (ref 70–99)
Glucose-Capillary: 117 mg/dL — ABNORMAL HIGH (ref 70–99)

## 2022-11-08 LAB — CULTURE, BLOOD (ROUTINE X 2)
Culture: NO GROWTH
Culture: NO GROWTH

## 2022-11-08 MED ORDER — HALOPERIDOL 1 MG PO TABS: 0.5000 mg | ORAL_TABLET | ORAL | Status: DC | PRN

## 2022-11-08 MED ORDER — HALOPERIDOL LACTATE 2 MG/ML PO CONC: 0.5000 mg | ORAL | Status: DC | PRN

## 2022-11-08 MED ORDER — GLYCOPYRROLATE 0.2 MG/ML IJ SOLN: 0.2000 mg | INTRAMUSCULAR | Status: DC | PRN

## 2022-11-08 MED ORDER — BIOTENE DRY MOUTH MT LIQD: 15.0000 mL | OROMUCOSAL | Status: DC | PRN

## 2022-11-08 MED ORDER — POTASSIUM CHLORIDE CRYS ER 20 MEQ PO TBCR: 40.0000 meq | EXTENDED_RELEASE_TABLET | Freq: Once | ORAL | Status: DC

## 2022-11-08 MED ORDER — LORAZEPAM 2 MG/ML PO CONC: 1.0000 mg | ORAL | Status: DC | PRN

## 2022-11-08 MED ORDER — LORAZEPAM 2 MG/ML IJ SOLN
1.0000 mg | INTRAMUSCULAR | Status: DC | PRN
  Administered 2022-11-08: 1 mg via INTRAVENOUS

## 2022-11-08 MED ORDER — HYDROMORPHONE BOLUS VIA INFUSION
1.0000 mg | INTRAVENOUS | Status: DC | PRN
  Administered 2022-11-08 – 2022-11-09 (×3): 1 mg via INTRAVENOUS

## 2022-11-08 MED ORDER — ONDANSETRON HCL 4 MG/2ML IJ SOLN: 4.0000 mg | Freq: Four times a day (QID) | INTRAMUSCULAR | Status: DC | PRN

## 2022-11-08 MED ORDER — MORPHINE SULFATE (PF) 2 MG/ML IV SOLN
1.0000 mg | INTRAVENOUS | Status: DC | PRN
  Administered 2022-11-08: 1 mg via INTRAVENOUS
  Filled 2022-11-08: qty 1

## 2022-11-08 MED ORDER — SODIUM CHLORIDE 0.9% FLUSH: 3.0000 mL | INTRAVENOUS | Status: DC | PRN

## 2022-11-08 MED ORDER — LINEZOLID 600 MG/300ML IV SOLN
600.0000 mg | Freq: Two times a day (BID) | INTRAVENOUS | Status: DC
  Filled 2022-11-08 (×2): qty 300

## 2022-11-08 MED ORDER — GLYCOPYRROLATE 1 MG PO TABS: 1.0000 mg | ORAL_TABLET | ORAL | Status: DC | PRN

## 2022-11-08 MED ORDER — SODIUM CHLORIDE 0.9 % IV SOLN: 2.0000 g | Freq: Two times a day (BID) | INTRAVENOUS | Status: DC

## 2022-11-08 MED ORDER — SODIUM CHLORIDE 0.9% FLUSH
3.0000 mL | Freq: Two times a day (BID) | INTRAVENOUS | Status: DC
  Administered 2022-11-08 – 2022-11-09 (×2): 3 mL via INTRAVENOUS

## 2022-11-08 MED ORDER — FUROSEMIDE 10 MG/ML IJ SOLN
20.0000 mg | Freq: Once | INTRAMUSCULAR | Status: AC
  Administered 2022-11-08: 20 mg via INTRAVENOUS
  Filled 2022-11-08: qty 2

## 2022-11-08 MED ORDER — LORAZEPAM 2 MG/ML IJ SOLN
1.0000 mg | INTRAMUSCULAR | Status: DC | PRN
  Administered 2022-11-09: 1 mg via INTRAVENOUS
  Filled 2022-11-08 (×2): qty 1

## 2022-11-08 MED ORDER — MAGIC MOUTHWASH W/LIDOCAINE: 15.0000 mL | Freq: Four times a day (QID) | ORAL | Status: DC | PRN

## 2022-11-08 MED ORDER — HALOPERIDOL LACTATE 5 MG/ML IJ SOLN: 0.5000 mg | INTRAMUSCULAR | Status: DC | PRN

## 2022-11-08 MED ORDER — ACETAMINOPHEN 325 MG PO TABS: 650.0000 mg | ORAL_TABLET | Freq: Four times a day (QID) | ORAL | Status: DC | PRN

## 2022-11-08 MED ORDER — NYSTATIN 100000 UNIT/GM EX POWD: Freq: Three times a day (TID) | CUTANEOUS | Status: DC | PRN

## 2022-11-08 MED ORDER — HYDRALAZINE HCL 20 MG/ML IJ SOLN: 10.0000 mg | Freq: Four times a day (QID) | INTRAMUSCULAR | Status: DC | PRN

## 2022-11-08 MED ORDER — SODIUM CHLORIDE 0.9 % IV SOLN: 250.0000 mL | INTRAVENOUS | Status: DC | PRN

## 2022-11-08 MED ORDER — LORAZEPAM 1 MG PO TABS: 1.0000 mg | ORAL_TABLET | ORAL | Status: DC | PRN

## 2022-11-08 MED ORDER — ONDANSETRON 4 MG PO TBDP: 4.0000 mg | ORAL_TABLET | Freq: Four times a day (QID) | ORAL | Status: DC | PRN

## 2022-11-08 MED ORDER — HYDROMORPHONE HCL-NACL 50-0.9 MG/50ML-% IV SOLN
1.0000 mg/h | INTRAVENOUS | Status: DC
  Administered 2022-11-08: 1 mg/h via INTRAVENOUS
  Filled 2022-11-08 (×2): qty 50

## 2022-11-08 MED ORDER — ALBUTEROL SULFATE (2.5 MG/3ML) 0.083% IN NEBU: 2.5000 mg | INHALATION_SOLUTION | RESPIRATORY_TRACT | Status: DC | PRN

## 2022-11-08 MED ORDER — DIPHENHYDRAMINE HCL 50 MG/ML IJ SOLN: 12.5000 mg | INTRAMUSCULAR | Status: DC | PRN

## 2022-11-08 MED ORDER — ACETAMINOPHEN 650 MG RE SUPP: 650.0000 mg | Freq: Four times a day (QID) | RECTAL | Status: DC | PRN

## 2022-11-08 NOTE — Progress Notes (Signed)
NAME:  Sarah Key, MRN:  [id number], DOB:  October 05, 1945, LOS: 5 ADMISSION DATE:  11/16/2022, CONSULTATION DATE:  11/06/22 REFERRING MD:  Dr. Avon Gully, CHIEF COMPLAINT: SOB   History of Present Illness:  77 y/o F who presented to her PCP on 3/8 with reports of frequent falls, dizziness.   On presentation to her PCP, she was documented to be ill appearing, short of breath, pale, & confused. She had apparently hit her head with one of her multiple recent falls. She was referred to the ER via EMS. On arrival to the ER, she also reported bilateral rib pain with inspiration and cough with green sputum production. Work up notable for large consolidative opacity on the right. 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 CT head imaging negative for acute process. She was admitted by [facility], treated as CAP. COVID/Flu A&B/RSV screening negative. Her initial O2 needs were 2L but over her course of admission, she escalated to salter O2 then BiPAP on 3/11.  She was given lasix with 2L response.  Her CXR had considerable worsening of consolidative process on the right + effusion.  She was lethargic.  ABG assessed showed respiratory alkalosis, hypoxia.   PCCM consulted for evaluation.   Pertinent  Medical History  Arthritis  Depression  Renal Stones HLD  HTN  PVC's DM II - on insulin  Osteoporosis  Significant Hospital Events: Including procedures, antibiotic start and stop dates in addition to other pertinent events   3/8 Admit  3/11 PCCM consulted for pulmonary evaluation > BiPAP, hypoxic  Interim History / Subjective:  Currently back on BiPAP at 100%  Objective   Blood pressure (!) 179/70, pulse 79, temperature 97.8 F (36.6 C), temperature source  Axillary, resp. rate (!) 25, SpO2 90 %.    Vent Mode: BIPAP FiO2 (%):  [70 %-80 %] 70 % Set Rate:  [16 bmp] 16 bmp PEEP:  [7 cmH20] 7 cmH20   Intake/Output Summary (Last 24 hours) at 11/08/2022 1040 Last data filed at 11/07/2022 1347 Gross per 24 hour  Intake --  Output 250 ml  Net -250 ml   There were no vitals filed for this visit.  Examination: General: Frail elderly female who is currently on 100% BiPAP HEENT: MM pink/moist no JVD Neuro: [place] and follow CV: Heart sounds are irregular PULM: Diminished breath sounds despite being on BiPAP at 100%   GI: soft, bsx4 active  GU: Voids Extremities: warm/dry, 1+ edema  Skin: no rashes or lesions      Resolved Hospital Problem list      Assessment & Plan:   Acute Hypoxic Respiratory Failure in the setting of CAP  Significant right sided infiltrate on admission, progressive on plan film. Strep antigen negative. Progressive O2 needs, on BiPAP.  She is a DNR We can try heated high flow nasal cannula I suspect she is end-stage in nature Continue antimicrobial therapy Diuresis as tolerated Goal of care may need to be more palliative in nature     Best Practice (right click and "Reselect all SmartList Selections" daily)  Per [facility]   Labs   CBC: Recent Labs  Lab 11/16/2022 1609 11/14/2022 2357 11/04/22 0251 11/05/22 0546 11/07/22 0550  WBC 12.1* 10.4 11.0* 20.4* 20.6*  NEUTROABS 9.4*  --  9.4*  --   --  HGB 11.4* 11.1* 11.2* 10.9* 11.7*  HCT 32.3* 33.5* 32.1* 32.7* 34.0*  MCV 81.6 84.8 80.7 83.8 82.5  PLT 277 235 257 325 [id number]    Basic Metabolic Panel: Recent Labs  Lab 10/30/2022 2045 11/05/2022 2357 11/04/22 0251 11/04/22 0813 11/05/22 0546 11/07/22 0550  NA 119*  --  122* 123* 126* 133*  K 3.0*  --  3.6 4.0 3.5 3.4*  CL 85*  --  93* 96* 98 98  CO2 23  --  19* 16* 18* 21*  GLUCOSE 204*  --  240* 272* 137* 185*  BUN 20  --  '17 16 15 21  '$ CREATININE 0.90 0.89 0.76 0.82 0.67 0.61  CALCIUM 7.7*  --  7.4* 7.5*  7.5* 8.0*  MG 2.0  --   --   --   --   --    GFR: Estimated Creatinine Clearance: 58 mL/min (by C-G formula based on SCr of 0.61 mg/dL). Recent Labs  Lab 11/19/2022 2357 11/04/22 0251 11/05/22 0546 11/07/22 0550  WBC 10.4 11.0* 20.4* 20.6*    Liver Function Tests: Recent Labs  Lab 11/20/2022 1609  AST 38  ALT 35  ALKPHOS 60  BILITOT 0.8  PROT 5.7*  ALBUMIN 2.9*   No results for input(s): "LIPASE", "AMYLASE" in the last 168 hours. No results for input(s): "AMMONIA" in the last 168 hours.  ABG    Component Value Date/Time   PHART 7.49 (H) 11/06/2022 1116   PCO2ART 28 (L) 11/06/2022 1116   PO2ART 57 (L) 11/06/2022 1116   HCO3 20.9 11/06/2022 1116   ACIDBASEDEF 1.4 11/06/2022 1116   O2SAT 91.9 11/06/2022 1116     Coagulation Profile: No results for input(s): "INR", "PROTIME" in the last 168 hours.  Cardiac Enzymes: No results for input(s): "CKTOTAL", "CKMB", "CKMBINDEX", "TROPONINI" in the last 168 hours.  HbA1C: Hemoglobin A1C  Date/Time Value Ref Range Status  07/27/2022 10:51 AM 8.2 (A) 4.0 - 5.6 % Final    CBG: Recent Labs  Lab 11/07/22 0740 11/07/22 1215 11/07/22 1608 11/07/22 2025 11/08/22 0809  GLUCAP 145* 144* 146* 159* 107*      Critical care time: n/a    Richardson Landry Momoka [place]field ACNP Acute Care Nurse Practitioner [place] Please consult [place] 11/08/2022, 10:40 AM

## 2022-11-08 NOTE — Progress Notes (Addendum)
PROGRESS NOTE        PATIENT DETAILS Name: Sarah Key Age: 77 y.o. Sex: female Date of Birth: February 11, 1946 Admit Date: 10/28/2022 Admitting Physician Quintella Baton, MD [id number], Real Cons, MD  Brief Summary: Patient is a 77 y.o.  female with history of DM-2, HTN, HLD, RLS, depression who presented with shortness of breath-she was found to have acute hypoxic respiratory failure due to PNA.  Significant events: 3/8>> admit to [facility] 3/11>> worsening hypoxemia-BiPAP started-PCCM consult 3/13>> still on BiPAP/HFNC-DNR confirmed with patient/family  Significant studies: 3/08>> CT head: No acute intracranial findings 3/08>> CT angio chest: Multifocal PNA  Significant microbiology data: 3/08>> COVID/influenza/RSV PCR: Negative 3/08>> blood culture: No growth  Procedures: None  Consults: None  Subjective: Does not feel any better-thinks she is not getting enough air.  On salter high flow.  Have asked nursing staff to switch her to heated high flow.  Reconfirms DNR-does not want intubation AT ANY COST  Objective: Vitals: Blood pressure (!) 180/62, pulse 80, temperature 97.9 F (36.6 C), temperature source Axillary, resp. rate (!) 21, SpO2 90 %.   Exam: Gen Exam:Alert awake-slightly tachypneic but not in any distress. HEENT:atraumatic, normocephalic Chest: Rales mostly in the right lung up to mid zone area. CVS:S1S2 regular Abdomen:soft non tender, non distended Extremities:no edema Neurology: Non focal Skin: no rash  Pertinent Labs/Radiology:    Latest Ref Rng & Units 11/07/2022    5:50 AM 11/05/2022    5:46 AM 11/04/2022    2:51 AM  CBC  WBC 4.0 - 10.5 K/uL 20.6  20.4  11.0   Hemoglobin 12.0 - 15.0 g/dL 11.7  10.9  11.2   Hematocrit 36.0 - 46.0 % 34.0  32.7  32.1   Platelets 150 - 400 K/uL 320  325  257     Lab Results  Component Value Date   NA 133 (L) 11/07/2022   K 3.4 (L) 11/07/2022   CL 98 11/07/2022   CO2 21 (L)  11/07/2022      Assessment/Plan: Acute hypoxic respiratory failure Multifocal PNA Continues to have severe hypoxemia with some amount of discomfort/air hunger Extensive discussion with patient earlier this morning and then with spouse/niece at bedside-all aware of tenuous clinical situation-patient has been very clear-she does not want to be intubated-she is aware that if PNA/hypoxia continues to worsen-this could lead to her demise.   Patient/family okay to start some as needed low-dose morphine for cough/air hunger Since no improvement on Rocephin/Zithromax-will switch to Zyvox/cefepime to see if this helps.  Repeat dose of IV Lasix today to ensure negative balance. Watch closely-but if she deteriorates any further-she will be need to be transition to full comfort measures.  Family/patient agreeable with this plan.  Acute metabolic encephalopathy Remains lethargic-but otherwise awake/alert Likely secondary to severe hypoxemia. Continue supportive care.  HTN BP on the higher side-per RN-patient refused antihypertensives Add as needed IV hydralazine  DM-2 CBG stable with 30 units of Semglee daily and SSI.  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  BMI: Estimated body mass index is 26.87 kg/m as calculated from the following:   Height as of an earlier encounter on 11/14/2022: 5' 4.5" (1.638 m).   Weight as of an earlier encounter on 10/27/2022: 72.1 kg.   Code status:   Code  Status: DNR   DVT Prophylaxis: enoxaparin (LOVENOX) injection 40 mg Start: 10/27/2022 2230   Family Communication: Spouse and niece at bedside   Disposition Plan: Status is: Inpatient Remains inpatient appropriate because: Severity of illness   Planned Discharge Destination:Skilled nursing facility if she survives this hospitalization   Diet: Diet Order             Diet heart healthy/carb modified Room service appropriate? Yes; Fluid consistency: Thin  Diet effective now                     Antimicrobial agents: Anti-infectives (From admission, onward)    Start     Dose/Rate Route Frequency Ordered Stop   11/05/22 1900  azithromycin (ZITHROMAX) tablet 500 mg        500 mg Oral Daily 11/05/22 1107 11/05/2022 0959   11/04/22 1900  cefTRIAXone (ROCEPHIN) 2 g in sodium chloride 0.9 % 100 mL IVPB        2 g 200 mL/hr over 30 Minutes Intravenous Every 24 hours 10/29/2022 2228 11/11/22 1759   11/04/22 1900  azithromycin (ZITHROMAX) 500 mg in sodium chloride 0.9 % 250 mL IVPB  Status:  Discontinued        500 mg 250 mL/hr over 60 Minutes Intravenous Every 24 hours 11/05/2022 2228 11/05/22 1107   11/02/2022 1915  cefTRIAXone (ROCEPHIN) 2 g in sodium chloride 0.9 % 100 mL IVPB        2 g 200 mL/hr over 30 Minutes Intravenous  Once 11/08/2022 1909 11/11/2022 2119   11/22/2022 1915  azithromycin (ZITHROMAX) 500 mg in sodium chloride 0.9 % 250 mL IVPB        500 mg 250 mL/hr over 60 Minutes Intravenous  Once 11/24/2022 1909 11/18/2022 2146        MEDICATIONS: Scheduled Meds:  albuterol  2.5 mg Nebulization BID   aspirin EC  81 mg Oral Daily   atenolol  100 mg Oral Daily   azithromycin  500 mg Oral Daily   budesonide  3 mg Oral Daily   buPROPion  300 mg Oral Daily   cyanocobalamin  2,500 mcg Oral Daily   diltiazem  240 mg Oral Daily   enoxaparin (LOVENOX) injection  40 mg Subcutaneous Q24H   ezetimibe  10 mg Oral Daily   famotidine  40 mg Oral q morning   furosemide  20 mg Intravenous  Once   hydrocortisone sod succinate (SOLU-CORTEF) inj  200 mg Intravenous Daily   insulin aspart  0-15 Units Subcutaneous TID WC   insulin aspart  0-5 Units Subcutaneous QHS   insulin glargine-yfgn  30 Units Subcutaneous QHS   irbesartan  150 mg Oral Daily   potassium chloride  40 mEq Oral Once   pravastatin  40 mg Oral Daily   pyridOXINE  100 mg Oral Daily   sucralfate  1 g Oral BID   Continuous Infusions:  cefTRIAXone (ROCEPHIN)  IV Stopped (11/07/22 2100)   PRN Meds:.diazepam, guaiFENesin, ipratropium-albuterol, melatonin   I have personally reviewed following labs and imaging studies  LABORATORY DATA: CBC: Recent Labs  Lab 11/01/2022 1609 11/12/2022 2357 11/04/22 0251 11/05/22 0546 11/07/22 0550  WBC  12.1* 10.4 11.0* 20.4* 20.6*  NEUTROABS 9.4*  --  9.4*  --   --   HGB 11.4* 11.1* 11.2* 10.9* 11.7*  HCT 32.3* 33.5* 32.1* 32.7* 34.0*  MCV 81.6 84.8 80.7 83.8 82.5  PLT 277 235 257 325 [id number]    Basic Metabolic Panel: Recent Labs  Lab 10/28/2022 2045 11/20/2022 2357 11/04/22 0251 11/04/22 0813 11/05/22 0546 11/07/22 0550  NA 119*  --  122* 123* 126* 133*  K 3.0*  --  3.6 4.0 3.5 3.4*  CL 85*  --  93* 96* 98 98  CO2 23  --  19* 16* 18* 21*  GLUCOSE 204*  --  240* 272* 137* 185*  BUN 20  --  '17 16 15 21  '$ CREATININE 0.90 0.89 0.76 0.82 0.67 0.61  CALCIUM 7.7*  --  7.4* 7.5* 7.5* 8.0*  MG 2.0  --   --   --   --   --     GFR: Estimated Creatinine Clearance: 58 mL/min (by C-G formula based on SCr of 0.61 mg/dL).  Liver Function Tests: Recent Labs  Lab 11/07/2022 1609  AST 38  ALT 35  ALKPHOS 60  BILITOT 0.8  PROT 5.7*  ALBUMIN 2.9*   No results for input(s): "LIPASE", "AMYLASE" in the last 168 hours. No results for input(s): "AMMONIA" in the last 168 hours.  Coagulation Profile: No results for input(s): "INR", "PROTIME" in the last 168 hours.  Cardiac Enzymes: No results for input(s): "CKTOTAL", "CKMB", "CKMBINDEX", "TROPONINI" in the last 168 hours.  BNP  (last 3 results) No results for input(s): "PROBNP" in the last 8760 hours.  Lipid Profile: No results for input(s): "CHOL", "HDL", "LDLCALC", "TRIG", "CHOLHDL", "LDLDIRECT" in the last 72 hours.  Thyroid Function Tests: No results for input(s): "TSH", "T4TOTAL", "FREET4", "T3FREE", "THYROIDAB" in the last 72 hours.  Anemia Panel: No results for input(s): "VITAMINB12", "FOLATE", "FERRITIN", "TIBC", "IRON", "RETICCTPCT" in the last 72 hours.  Urine analysis:    Component Value Date/Time   COLORURINE YELLOW 11/11/2022 2339   APPEARANCEUR CLEAR 11/05/2022 2339   LABSPEC 1.030 11/10/2022 2339   PHURINE 6.0 11/12/2022 2339   GLUCOSEU 50 (A) 11/12/2022 2339   HGBUR NEGATIVE 11/26/2022 2339   BILIRUBINUR NEGATIVE 11/01/2022 2339   KETONESUR 20 (A) 11/26/2022 2339   PROTEINUR NEGATIVE 11/01/2022 2339   NITRITE NEGATIVE 11/02/2022 2339   LEUKOCYTESUR NEGATIVE 11/01/2022 2339    Sepsis Labs: Lactic Acid, Venous No results found for: "LATICACIDVEN"  MICROBIOLOGY: Recent Results (from the past 240 hour(s))  Resp panel by RT-PCR (RSV, Flu A&B, Covid) Anterior Nasal Swab     Status: None   Collection Time: 11/13/2022  4:17 PM   Specimen: Anterior Nasal Swab  Result Value Ref Range Status   SARS Coronavirus 2 by RT PCR NEGATIVE NEGATIVE Final   Influenza A by PCR NEGATIVE NEGATIVE Final   Influenza B by PCR NEGATIVE NEGATIVE Final    Comment: (NOTE) The Xpert Xpress SARS-CoV-2/FLU/RSV plus assay is intended as an aid in the diagnosis of influenza from Nasopharyngeal swab specimens and should not be used as a sole basis for treatment. Nasal washings and aspirates are unacceptable for Xpert Xpress SARS-CoV-2/FLU/RSV testing.  Fact Sheet for Patients: EntrepreneurPulse.com.au  Fact Sheet for Healthcare Providers: IncredibleEmployment.be  This test is not yet approved or cleared by the [place] FDA and has been authorized for detection and/or  diagnosis of SARS-CoV-2 by FDA under an Emergency Use Authorization (EUA). This EUA will remain in effect (meaning this  test can be used) for the duration of the COVID-19 declaration under Section 564(b)(1) of the Act, 21 U.S.C. section 360bbb-3(b)(1), unless the authorization is terminated or revoked.     Resp Syncytial Virus by PCR NEGATIVE NEGATIVE Final    Comment: (NOTE) Fact Sheet for Patients: EntrepreneurPulse.com.au  Fact Sheet for Healthcare Providers: IncredibleEmployment.be  This test is not yet approved or cleared by the [place] FDA and has been authorized for detection and/or diagnosis of SARS-CoV-2 by FDA under an Emergency Use Authorization (EUA). This EUA will remain in effect (meaning this test can be used) for the duration of the COVID-19 declaration under Section 564(b)(1) of the Act, 21 U.S.C. section 360bbb-3(b)(1), unless the authorization is terminated or revoked.  Performed at [facility], [place] [address] [place], [place] [address]   Blood culture (routine x 2)     Status: None   Collection Time: 11/04/2022  8:13 PM   Specimen: BLOOD LEFT HAND  Result Value Ref Range Status   Specimen Description BLOOD LEFT HAND  Final   Special Requests   Final    BOTTLES DRAWN AEROBIC AND ANAEROBIC Blood Culture results may not be optimal due to an inadequate volume of blood received in culture bottles   Culture   Final    NO GROWTH 5 DAYS Performed at [facility], [place] [address], [place], [place] [address]    Report Status 11/08/2022 FINAL  Final  Blood culture (routine x 2)     Status: None   Collection Time: 11/15/2022  8:19 PM   Specimen: BLOOD RIGHT HAND  Result Value Ref Range Status   Specimen Description BLOOD RIGHT HAND  Final   Special Requests   Final    BOTTLES DRAWN AEROBIC AND ANAEROBIC Blood Culture results may not be optimal due to an inadequate volume of blood received in culture bottles    Culture   Final    NO GROWTH 5 DAYS Performed at [facility], [place] [address] [place], [place] [address]    Report Status 11/08/2022 FINAL  Final    RADIOLOGY STUDIES/RESULTS: No results found.   LOS: 5 days   Oren Binet, MD  [facility]    To contact the attending provider between 7A-7P or the covering provider during after hours 7P-7A, please log into the web site [email or web address] and access using universal [place] password for that web site. If you do not have the password, please call the hospital operator.  11/08/2022, 1:30 PM

## 2022-11-08 NOTE — Progress Notes (Signed)
  Transition of Care (TOC) Screening Note   Patient Details  Name: Clemmie Buelna Ardito Date of Birth: [birth year]   Transition of Care [facility]) CM/SW Contact:    Benard Halsted, LCSW Phone Number: 11/08/2022, 8:59 AM    Transition of Care Department [facility]) has reviewed patient and no TOC needs have been identified at this time. We will continue to monitor patient advancement through interdisciplinary progression rounds. If new patient transition needs arise, please place a TOC consult.

## 2022-11-08 NOTE — Progress Notes (Addendum)
Have seen patient multiple times during the course of today She still appears uncomfortable even after IV morphine Has refused IV antibiotics Had another long discussion with patient along with spouse and niece at bedside-patient at this time feels that she is not getting better anymore-and wants to transition to full comfort measures.  Will start Dilaudid gtt-and initiate full comfort measures at patient/family's request.  Anticipate inpatient death-with the degree of hypoxemia-do not think she will be stable to transfer to residential hospice.

## 2022-11-09 DIAGNOSIS — Z515 Encounter for palliative care: Secondary | ICD-10-CM | POA: Diagnosis not present

## 2022-11-09 DIAGNOSIS — J189 Pneumonia, unspecified organism: Secondary | ICD-10-CM | POA: Diagnosis not present

## 2022-11-09 DIAGNOSIS — R0609 Other forms of dyspnea: Secondary | ICD-10-CM

## 2022-11-09 DIAGNOSIS — E1169 Type 2 diabetes mellitus with other specified complication: Secondary | ICD-10-CM | POA: Diagnosis not present

## 2022-11-09 DIAGNOSIS — G2581 Restless legs syndrome: Secondary | ICD-10-CM | POA: Diagnosis not present

## 2022-11-09 DIAGNOSIS — I10 Essential (primary) hypertension: Secondary | ICD-10-CM

## 2022-11-20 ENCOUNTER — Telehealth: Payer: Self-pay | Admitting: Internal Medicine

## 2022-11-27 NOTE — Consult Note (Signed)
Consultation Note Date: 11-24-22   Patient Name: Sarah Key  DOB: 1945/09/07  MRN: [id number]  Age / Sex: 77 y.o., female  PCP: Hoyt Koch, MD Referring Physician: Jonetta Osgood, MD  Reason for Consultation: Hospice Evaluation, Non pain symptom management, and Psychosocial/spiritual support  HPI/Patient Profile: 77 y.o. female   admitted on 11/05/2022 with   history of DM-2, HTN, HLD, RLS, depression who presented with shortness of breath-she was found to have acute hypoxic respiratory failure due to PNA.   Attending physician in conversation with family have made decision to transition to full comfort measures.  Anticipated hospital death    Clinical Assessment and Goals of Care:  This NP Wadie Lessen reviewed medical records, received report from team, assessed the patient and then meet at the patient's bedside along with her HPOA/ Molly   to discuss diagnosis, prognosis, GOC, EOL wishes disposition and options.   Concept of Palliative Care was introduced as specialized medical care for people and their families living with serious illness.  If focuses on providing relief from the symptoms and stress of a serious illness.  The goal is to improve quality of life for both the patient and the family.  Values and goals of care important to patient and family were attempted to be elicited.  Created space and opportunity for family to explore thoughts and feelings regarding current medical situation.  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    Questions and concerns addressed.  Family  encouraged to call with questions or concerns.    Emotional support offered   PMT will continue to support holistically.     SUMMARY OF RECOMMENDATIONS    Code Status/Advance Care  Planning: DNR   Symptom Management:  Pain/Dyspnea/agitation:   see MAR   Palliative Prophylaxis:  Frequent Pain Assessment and Oral Care  Additional Recommendations (Limitations, Scope, Preferences): Full Comfort Care  Psycho-social/Spiritual:  Desire for further Chaplaincy support:no Additional Recommendations: Education on Hospice  Prognosis:  Hours - Days  Discharge Planning: Anticipated Hospital Death      Primary Diagnoses: Present on Admission:  CAP (community acquired pneumonia)  Hyponatremia  Hypokalemia  Hypertension  Colitis  RLS (restless legs syndrome)   I have reviewed the medical record, interviewed the patient and family, and examined the patient. The following aspects are pertinent.  Past Medical History:  Diagnosis Date   Arthritis    Depression    History of kidney stones    passed   Hyperlipidemia associated with type 2 diabetes mellitus ([place])    Hypertension    Osteoporosis    PVC's (premature ventricular contractions)    Type II diabetes mellitus ([place])    Social History   Socioeconomic History   Marital status: Married    Spouse name: Not on file   Number of children: Not on file   Years of education: Not on file   Highest education level: Not  on file  Occupational History   Not on file  Tobacco Use   Smoking status: Never   Smokeless tobacco: Never  Vaping Use   Vaping Use: Never used  Substance and Sexual Activity   Alcohol use: Never   Drug use: Not on file   Sexual activity: Not on file  Other Topics Concern   Not on file  Social History Narrative   Not on file   Social Determinants of Health   Financial Resource Strain: Low Risk  (06/14/2022)   Overall Financial Resource Strain (CARDIA)    Difficulty of Paying Living Expenses: Not hard at all  Food Insecurity: No Food Insecurity (11/04/2022)   Hunger Vital Sign    Worried About Running Out of Food in the Last Year: Never true    Ran Out of Food in the Last Year:  Never true  Transportation Needs: No Transportation Needs (11/04/2022)   PRAPARE - Hydrologist (Medical): No    Lack of Transportation (Non-Medical): No  Physical Activity: Sufficiently Active (06/14/2022)   Exercise Vital Sign    Days of Exercise per Week: 5 days    Minutes of Exercise per Session: 30 min  Stress: No Stress Concern Present (06/14/2022)   [place]    Feeling of Stress : Not at all  Social Connections: Socially Integrated (06/14/2022)   Social Connection and Isolation Panel [NHANES]    Frequency of Communication with Friends and Family: More than three times a week    Frequency of Social Gatherings with Friends and Family: More than three times a week    Attends Religious Services: More than 4 times per year    Active Member of [employer] or Organizations: Yes    Attends Music therapist: More than 4 times per year    Marital Status: Married   Family History  Problem Relation Age of Onset   Colon cancer Maternal Grandfather    Colon polyps Neg Hx    Scheduled Meds:  azithromycin  500 mg Oral Daily   sodium chloride flush  3 mL Intravenous Q12H   Continuous Infusions:  sodium chloride     HYDROmorphone 1 mg/hr (11/08/22 1641)   PRN Meds:.sodium chloride, acetaminophen **OR** acetaminophen, albuterol, antiseptic oral rinse, diphenhydrAMINE, glycopyrrolate **OR** glycopyrrolate **OR** glycopyrrolate, haloperidol **OR** haloperidol **OR** haloperidol lactate, HYDROmorphone, LORazepam **OR** LORazepam **OR** LORazepam, LORazepam, magic mouthwash w/lidocaine, nystatin, ondansetron **OR** ondansetron (ZOFRAN) IV, sodium chloride flush Medications Prior to Admission:  Prior to Admission medications   Medication Sig Start Date End Date Taking? Authorizing Provider  aspirin EC 81 MG tablet Take 81 mg by mouth daily. Swallow whole.    [provider]  atenolol  (TENORMIN) 100 MG tablet TAKE ONE TABLET BY MOUTH DAILY 10/30/22   Hoyt Koch, MD  blood glucose meter kit and supplies by Other route as directed. Dispense based on patient and insurance preference. Use up to four times daily as directed. (FOR ICD-10 E10.9, E11.9).    [provider]  budesonide (ENTOCORT EC) 3 MG 24 hr capsule Take 1 capsule (3 mg total) by mouth daily. 09/15/22   Thornton Park, MD  buPROPion (WELLBUTRIN XL) 150 MG 24 hr tablet Take 2 tablets (300 mg total) by mouth every morning. 03/08/22   Hoyt Koch, MD  butalbital-aspirin-caffeine [facility]) [phone] MG capsule TAKE ONE CAPSULE EVERY 6 HOURS AS NEEDED FOR MIGRAINE 07/10/22   Hoyt Koch, MD  chlorthalidone (HYGROTON) 25 MG tablet TAKE ONE TABLET BY MOUTH DAILY 02/27/22   Buford Dresser, MD  Continuous Blood Gluc Sensor (DEXCOM G7 SENSOR) MISC 1 Device by Does not apply route as directed. 07/27/22   Shamleffer, Melanie Crazier, MD  Continuous Blood Gluc Sensor (FREESTYLE LIBRE 14 DAY SENSOR) MISC Use as instructed, change every 14 days 08/17/22   Shamleffer, Melanie Crazier, MD  Cyanocobalamin (B-12) 2500 MCG TABS Take 2,500 mcg by mouth daily.    [provider]  diltiazem (CARDIZEM CD) 240 MG 24 hr capsule Take 240 mg by mouth daily.    [provider]  doxycycline (PERIOSTAT) 20 MG tablet Take 40 mg by mouth daily as needed (rosacea flare). Patient not taking: Reported on 10/31/2022 08/17/14   [provider]  ezetimibe (ZETIA) 10 MG tablet TAKE ONE TABLET BY MOUTH ONCE DAILY 07/24/22   Hoyt Koch, MD  famotidine (PEPCID) 40 MG tablet TAKE ONE TABLET BY MOUTH IN THE MORNING 02/27/22   Buford Dresser, MD  insulin aspart (NOVOLOG FLEXPEN) 100 UNIT/ML FlexPen Max 50 units daily Novolog insulin to carb ratio 1 to 8 with each meal 07/28/22   Shamleffer, Melanie Crazier, MD  insulin glargine (LANTUS SOLOSTAR) 100 UNIT/ML Solostar Pen Inject 30  Units into the skin daily. 07/27/22   Shamleffer, Melanie Crazier, MD  Insulin Pen Needle 29G X 5MM MISC 1 Device by Does not apply route in the morning, at noon, in the evening, and at bedtime. 07/27/22   Shamleffer, Melanie Crazier, MD  Magnesium 400 MG CAPS Take 400 mg by mouth daily.    [provider]  Melatonin 5 MG CAPS Take 5 mg by mouth at bedtime as needed (sleep).    [provider]  METAMUCIL FIBER PO Take 6 capsules by mouth daily.    [provider]  metroNIDAZOLE (METROGEL) 1 % gel Apply topically. 05/29/22   [provider]  Multiple Vitamin (MULTIVITAMIN ADULT PO) Take 1 tablet by mouth daily.    [provider]  olmesartan (BENICAR) 40 MG tablet TAKE ONE TABLET BY MOUTH ONCE DAILY 10/30/22   Hoyt Koch, MD  omega-3 acid ethyl esters (LOVAZA) 1 g capsule TAKE TWO CAPSULES BY MOUTH TWICE DAILY. 12/26/21   Hoyt Koch, MD  ondansetron (ZOFRAN) 4 MG tablet Take 1 tablet (4 mg total) by mouth every 8 (eight) hours as needed for nausea or vomiting. 12/08/21   Hoyt Koch, MD  pravastatin (PRAVACHOL) 40 MG tablet TAKE ONE TABLET ONCE DAILY 02/13/22   Buford Dresser, MD  Pyridoxine HCl (B-6) 100 MG TABS Take 100 mg by mouth daily.    [provider]  rOPINIRole (REQUIP) 1 MG tablet Take 1 tablet (1 mg total) by mouth at bedtime. 06/30/22   Hoyt Koch, MD  sucralfate (CARAFATE) 1 g tablet Take 1 tablet by mouth two times a day. 07/03/22   Thornton Park, MD  TIADYLT ER 240 MG 24 hr capsule Take 1 capsule (240 mg total) by mouth daily. 07/24/22   Buford Dresser, MD  triamcinolone (KENALOG) 0.1 % Apply 1 application topically daily.    [provider]  UNABLE TO FIND Med Name: [facility] Kit    [provider]   Allergies  Allergen Reactions   Sulfa Antibiotics Anaphylaxis   Sulfites Anaphylaxis    Itching, diarrhea, headaches    Enalapril Maleate      Unknown reaction   Other     Tape BUT tolerates paper tape  Quinapril     Unknown reaction   Rosuvastatin     Unknown reaction   Ampicillin Itching and Rash   Review of Systems  Unable to perform ROS: Acuity of condition    Physical Exam Constitutional:      Appearance: She is underweight. She is ill-appearing.  Cardiovascular:     Rate and Rhythm: Bradycardia present.  Pulmonary:     Comments: Periods of apnea Skin:    General: Skin is cool.     Comments: Cool and mottled     Vital Signs: BP 113/67 (BP Location: Left Arm)   Pulse 64   Temp (!) 96.5 F (35.8 C) (Axillary)   Resp 20   SpO2 (!) 76%  Pain Scale: 0-10   Pain Score: Asleep   SpO2: SpO2: (!) 76 % O2 Device:SpO2: (!) 76 % O2 Flow Rate: .O2 Flow Rate (L/min): 4 L/min (comfort care pt, resting in bed)  IO: Intake/output summary:  Intake/Output Summary (Last 24 hours) at 16-Nov-2022 0811 Last data filed at 11/16/22 0530 Gross per 24 hour  Intake 3.14 ml  Output 1200 ml  Net -1196.86 ml    LBM:   Baseline Weight:   Most recent weight:       Palliative Assessment/Data: 10 %   Total time: 50  minutes   Signed by: Wadie Lessen, NP   Please contact Palliative Medicine Team phone at [phone] for questions and concerns.  For individual provider: See Shea Evans

## 2022-11-27 NOTE — Progress Notes (Signed)
Hydromorphone gtt waste of 15 ml into secure container. Witnessed by Pablo Lawrence, RN.

## 2022-11-27 NOTE — Death Summary Note (Signed)
DEATH SUMMARY   Patient Details  Name: Sarah Key MRN: [id number] DOB: 02/03/1946 [id number], Real Cons, MD Admission/Discharge Information   Admit Date:  11/10/2022  Date of Death: Date of Death: 2022/11/16  Time of Death: Time of Death: 17  Length of Stay: 6   Principle Cause of death: Acute hypoxic respiratory failure due to PNA  Hospital Diagnoses: Principal Problem:   CAP (community acquired pneumonia) Active Problems:   Diabetes mellitus ([place])   Hypertension   Colitis   RLS (restless legs syndrome)   Hyponatremia   Hypokalemia   Hyperlipidemia   Cirrhosis [facility])   Hospital Course: Patient is a 77 y.o.  female with history of DM-2, HTN, HLD, RLS, depression who presented with shortness of breath-she was found to have acute hypoxic respiratory failure due to PNA.   Significant events: 3/8>> admit to [facility] 3/11>> worsening hypoxemia-BiPAP started-PCCM consult 3/13>> still on BiPAP/HFNC-DNR confirmed with patient/family-continue to worsen-transitioned to full comfort measures 3/14>> patient expired at 10:15 AM   Significant studies: 3/08>> CT head: No acute intracranial findings 3/08>> CT angio chest: Multifocal PNA   Significant microbiology data: 3/08>> COVID/influenza/RSV PCR: Negative 3/08>> blood culture: No growth   Procedures: None   Consults: PCCM Palliative care  Assessment and Plan: Acute hypoxic respiratory failure Multifocal PNA Had severe severe hypoxemia requiring initiation of high flow oxygen-on 3/13-she appeared very uncomfortable-we tried heated high flow.  Per her advanced directives-she did not want to be intubated at any cost.  Antibiotics were changed to Zyvox/cefepime on 3/13.  Unfortunately patient appeared uncomfortable-she refused to take any further antibiotics-extensive discussion was done with patient/spouse/niece at bedside-she was subsequently transitioned to full comfort measures and started on Dilaudid infusion  given persistent air hunger/discomfort.  She was seen by this MD earlier this morning-she appeared comfortable but was having apneic spells, spouse and niece were at bedside.  She subsequently passed away at 1015 this morning.   Acute metabolic encephalopathy Remains lethargic-but otherwise awake/alert Likely secondary to severe hypoxemia.    HTN BP on the higher side-per RN-patient refused antihypertensives Was on as needed IV hydralazine   DM-2 CBG stable with 30 units of Semglee daily and SSI.   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  See above.  BMI: Estimated body mass index is 26.87 kg/m as calculated from the following:   Height as of an earlier encounter on November 10, 2022: 5' 4.5" (1.638 m).   Weight as of an earlier encounter on 11/10/22: 72.1 kg.    The results of significant diagnostics from this hospitalization (including imaging, microbiology, ancillary and laboratory) are listed below for reference.   Significant Diagnostic Studies: DG CHEST PORT 1 VIEW  Result Date: 11/06/2022 CLINICAL DATA:  [id number].  Labored breathing. EXAM: PORTABLE CHEST 1 VIEW COMPARISON:  CTA chest and portable chest both November 10, 2022 FINDINGS: 4:41 a.m. There is interval worsening of extensive airspace disease in the right lung, with patchy dense consolidation having worsened in the right upper lobe distribution and extended into the lower lung field. Only a portion of the right apex remains aerated. On the left there is worsening perihilar airspace disease in the mid to lower lung field consistent with  development of pneumonia on the  left. The cardiac size is stable. The mediastinum is normally outlined with moderate calcific plaque in the aorta. There are likely small pleural effusions but this is difficult to evaluate due to the density of the airspace disease. The left upper lung field remains clear. In all other respects no further changes. IMPRESSION: 1. Interval worsening of airspace disease in the right lung and left mid to lower lung field consistent with worsening pneumonia. 2. Probable small pleural effusions. 3. Aortic atherosclerosis. 4. No further changes. Electronically Signed   By: Telford Nab M.D.   On: 11/06/2022 06:54   CT Head Wo Contrast  Result Date: 11/15/2022 CLINICAL DATA:  Trauma, fall EXAM: CT HEAD WITHOUT CONTRAST TECHNIQUE: Contiguous axial images were obtained from the base of the skull through the vertex without intravenous contrast. RADIATION DOSE REDUCTION: This exam was performed according to the departmental dose-optimization program which includes automated exposure control, adjustment of the mA and/or kV according to patient size and/or use of iterative reconstruction technique. COMPARISON:  None Available. FINDINGS: Brain: No acute intracranial findings are seen. There are no signs of bleeding within the cranium. Cortical sulci are prominent. Vascular: Unremarkable. Skull: No fracture is seen in calvarium. Sinuses/Orbits: There is mucosal thickening in ethmoid sinus. Other: None. IMPRESSION: No acute intracranial findings are seen in noncontrast CT brain. Atrophy. Mild chronic sinusitis. Electronically Signed   By: Elmer Picker M.D.   On: 11/05/2022 19:07   CT Angio Chest PE W and/or Wo Contrast  Result Date: 10/29/2022 CLINICAL DATA:  Lung consolidation EXAM: CT ANGIOGRAPHY CHEST WITH CONTRAST TECHNIQUE: Multidetector CT imaging of the chest was performed using the standard protocol during bolus administration of intravenous contrast. Multiplanar CT image  reconstructions and MIPs were obtained to evaluate the vascular anatomy. RADIATION DOSE REDUCTION: This exam was performed according to the departmental dose-optimization program which includes automated exposure control, adjustment of the mA and/or kV according to patient size and/or use of iterative reconstruction technique. CONTRAST:  47m OMNIPAQUE IOHEXOL 350 MG/ML SOLN COMPARISON:  Chest radiograph done earlier today FINDINGS: Cardiovascular: Scattered calcifications are seen in thoracic aorta and its major branches. There is possible significant stenosis in proximal left subclavian. Coronary artery calcifications are seen. Dense calcification is seen in mitral annulus. Mediastinum/Nodes: No significant lymphadenopathy seen in mediastinum. There are slightly enlarged lymph nodes in both hilar regions, more so on the right side. Lungs/Pleura: There is large alveolar infiltrate in posterior segment of right upper lobe. There are patchy infiltrates in anterior and apical segments of right upper lobe, right middle lobe, lingula and right lower lobe. There is small right pleural effusion. There is no pneumothorax. Upper Abdomen: There is nodularity in liver surface. Small hiatal hernia is seen. Musculoskeletal: Degenerative changes are noted in lower cervical spine. Review of the MIP images confirms the above findings. IMPRESSION: There is large alveolar infiltrate involving most of the posterior segment of right upper lobe suggesting pneumonia. There are other small patchy infiltrates in the rest of the right upper lobe, lingula and right lower lobe suggesting multifocal pneumonia. Follow-up studies after antibiotic treatment should be considered to rule out any underlying obstructing neoplastic process. Small right pleural effusion.  There is no pneumothorax. Coronary artery disease. Thoracic aortic atherosclerosis. Possible significant stenosis in proximal left subclavian artery. Cirrhosis.  Small hiatal hernia.  Electronically Signed   By: PElmer PickerM.D.   On: 10/27/2022 19:03   DG Chest 2 View  Result Date: 11/01/2022 CLINICAL DATA:  Weakness EXAM: CHEST -  2 VIEW COMPARISON:  None Available. FINDINGS: No pleural effusion. No pneumothorax. Normal cardiac and mediastinal contours. There is a large consolidative opacity in the right mid lung, which could represent infection, further evaluation with a CT of the chest is recommended exclude the possibility of an underlying malignancy. No radiographically apparent displaced rib fractures. Visualized upper abdomen is unremarkable. Vertebral body heights are maintained. Aortic atherosclerotic calcifications. IMPRESSION: Large consolidative opacity in the right mid lung could represent infection, but further evaluation with a CT of the chest is recommended to exclude the possibility of an underlying malignancy. Electronically Signed   By: Marin Roberts M.D.   On: 11/24/2022 16:49    Microbiology: Recent Results (from the past 240 hour(s))  Resp panel by RT-PCR (RSV, Flu A&B, Covid) Anterior Nasal Swab     Status: None   Collection Time: 11/20/2022  4:17 PM   Specimen: Anterior Nasal Swab  Result Value Ref Range Status   SARS Coronavirus 2 by RT PCR NEGATIVE NEGATIVE Final   Influenza A by PCR NEGATIVE NEGATIVE Final   Influenza B by PCR NEGATIVE NEGATIVE Final    Comment: (NOTE) The Xpert Xpress SARS-CoV-2/FLU/RSV plus assay is intended as an aid in the diagnosis of influenza from Nasopharyngeal swab specimens and should not be used as a sole basis for treatment. Nasal washings and aspirates are unacceptable for Xpert Xpress SARS-CoV-2/FLU/RSV testing.  Fact Sheet for Patients: EntrepreneurPulse.com.au  Fact Sheet for Healthcare Providers: IncredibleEmployment.be  This test is not yet approved or cleared by the [place] FDA and has been authorized for detection and/or diagnosis of SARS-CoV-2 by FDA under  an Emergency Use Authorization (EUA). This EUA will remain in effect (meaning this test can be used) for the duration of the COVID-19 declaration under Section 564(b)(1) of the Act, 21 U.S.C. section 360bbb-3(b)(1), unless the authorization is terminated or revoked.     Resp Syncytial Virus by PCR NEGATIVE NEGATIVE Final    Comment: (NOTE) Fact Sheet for Patients: EntrepreneurPulse.com.au  Fact Sheet for Healthcare Providers: IncredibleEmployment.be  This test is not yet approved or cleared by the [place] FDA and has been authorized for detection and/or diagnosis of SARS-CoV-2 by FDA under an Emergency Use Authorization (EUA). This EUA will remain in effect (meaning this test can be used) for the duration of the COVID-19 declaration under Section 564(b)(1) of the Act, 21 U.S.C. section 360bbb-3(b)(1), unless the authorization is terminated or revoked.  Performed at [facility], [place] [address] [place], [place] [address]   Blood culture (routine x 2)     Status: None   Collection Time: 11/19/2022  8:13 PM   Specimen: BLOOD LEFT HAND  Result Value Ref Range Status   Specimen Description BLOOD LEFT HAND  Final   Special Requests   Final    BOTTLES DRAWN AEROBIC AND ANAEROBIC Blood Culture results may not be optimal due to an inadequate volume of blood received in culture bottles   Culture   Final    NO GROWTH 5 DAYS Performed at [facility], [place] [address] [place],  [address]    Report Status 11/08/2022 FINAL  Final  Blood culture (routine x 2)     Status: None   Collection Time: 11/20/2022  8:19 PM   Specimen: BLOOD RIGHT HAND  Result Value Ref Range Status   Specimen Description BLOOD RIGHT HAND  Final   Special Requests   Final    BOTTLES DRAWN AEROBIC AND ANAEROBIC Blood Culture results  may not be optimal due to an inadequate volume of blood received in culture bottles   Culture   Final    NO GROWTH 5  DAYS Performed at [facility], [place] [address], [place], [place] [address]    Report Status 11/08/2022 FINAL  Final    Time spent: 35  minutes  Signed: Oren Binet, MD

## 2022-11-27 DEATH — deceased

## 2022-12-04 ENCOUNTER — Ambulatory Visit: Payer: Medicare PPO | Admitting: Internal Medicine

## 2022-12-29 ENCOUNTER — Ambulatory Visit: Payer: Medicare PPO | Admitting: Internal Medicine

## 2023-05-09 IMAGING — MG MM DIGITAL SCREENING BILAT W/ TOMO AND CAD
8 series · 9 of 24 positions shown · non-contrast
Comparison: Previous exam(s).

CLINICAL DATA: Screening.

EXAM:
DIGITAL SCREENING BILATERAL MAMMOGRAM WITH TOMOSYNTHESIS AND CAD
TECHNIQUE: Bilateral screening digital craniocaudal and mediolateral oblique
mammograms were obtained. Bilateral screening digital breast
tomosynthesis was performed. The images were evaluated with
computer-aided detection.

[R MLO synth-2D]
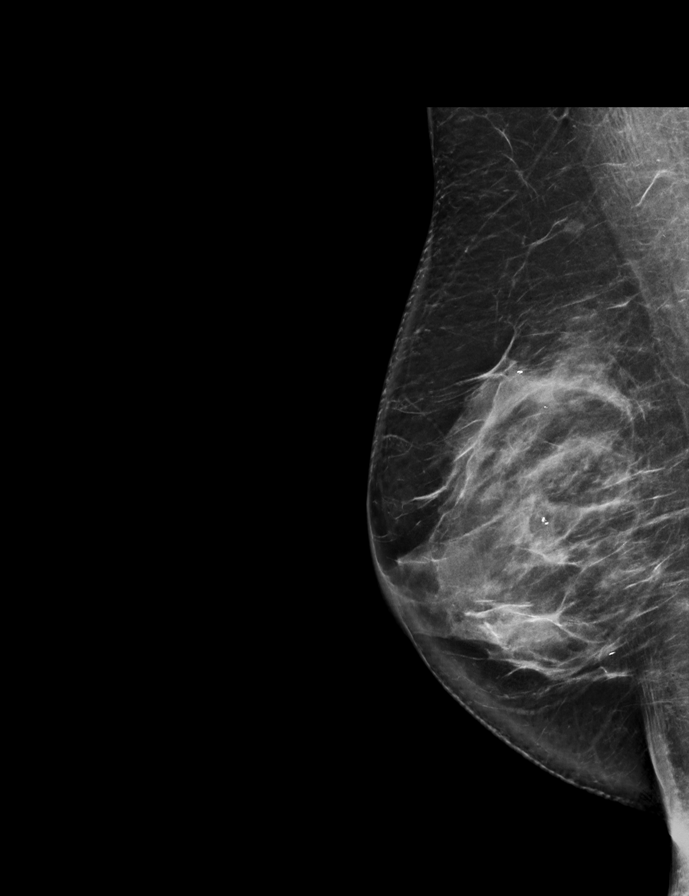

[L CC synth-2D]
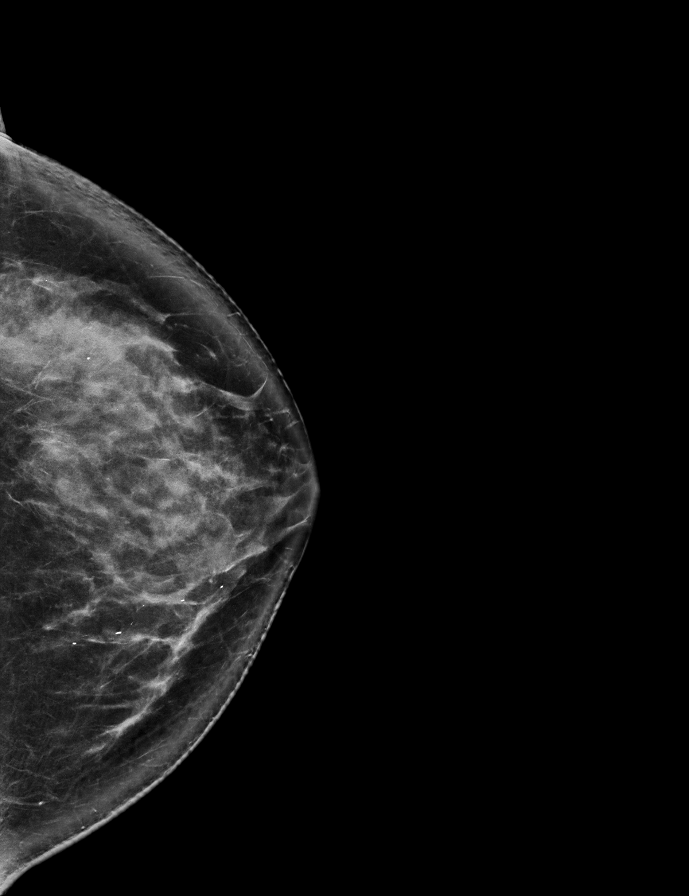

[R CC synth-2D]
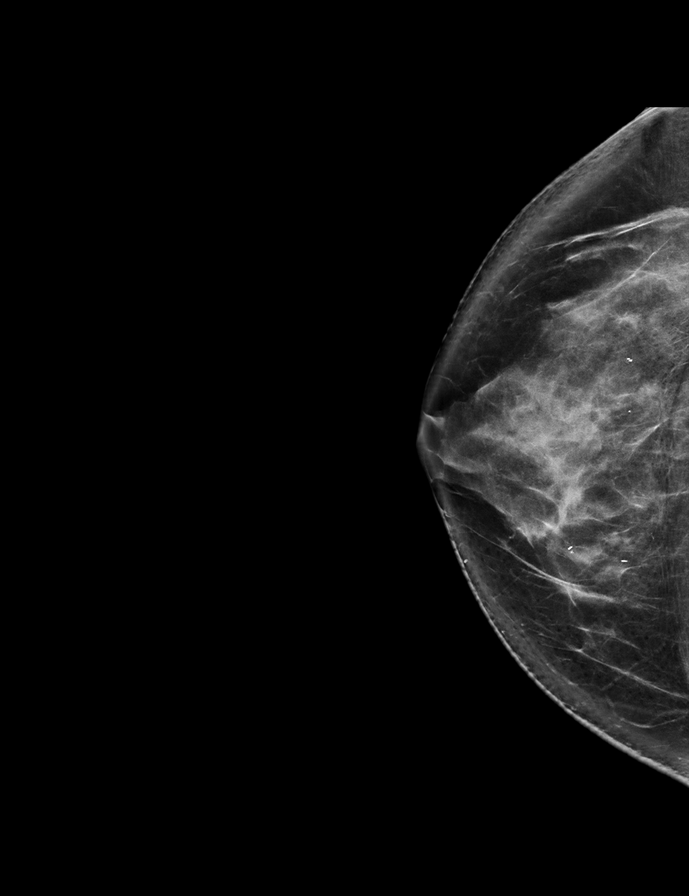

[L MLO synth-2D]
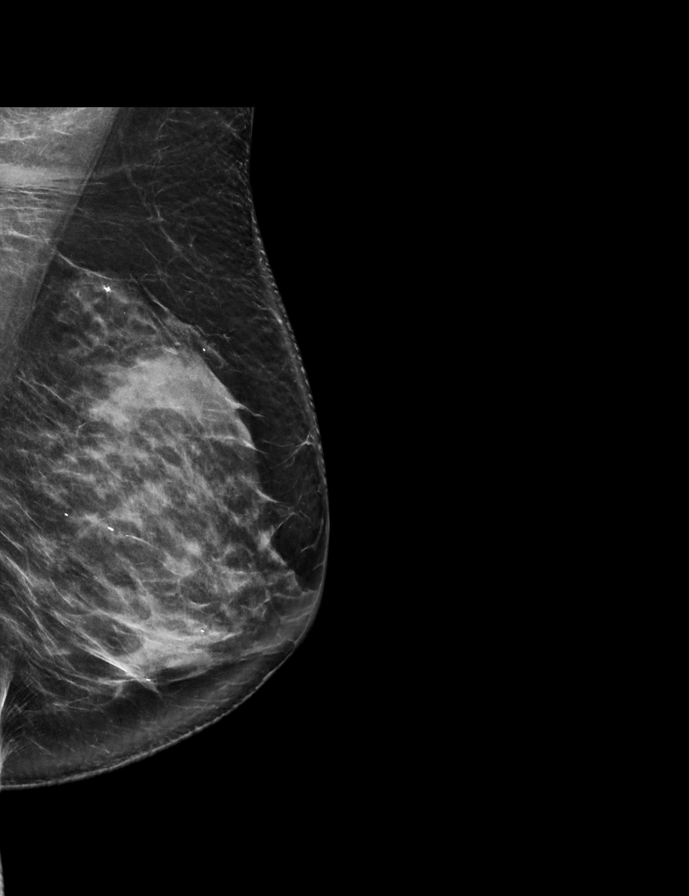

[R MLO tomo · 2 of 84 frames shown]
[frame 28/84]
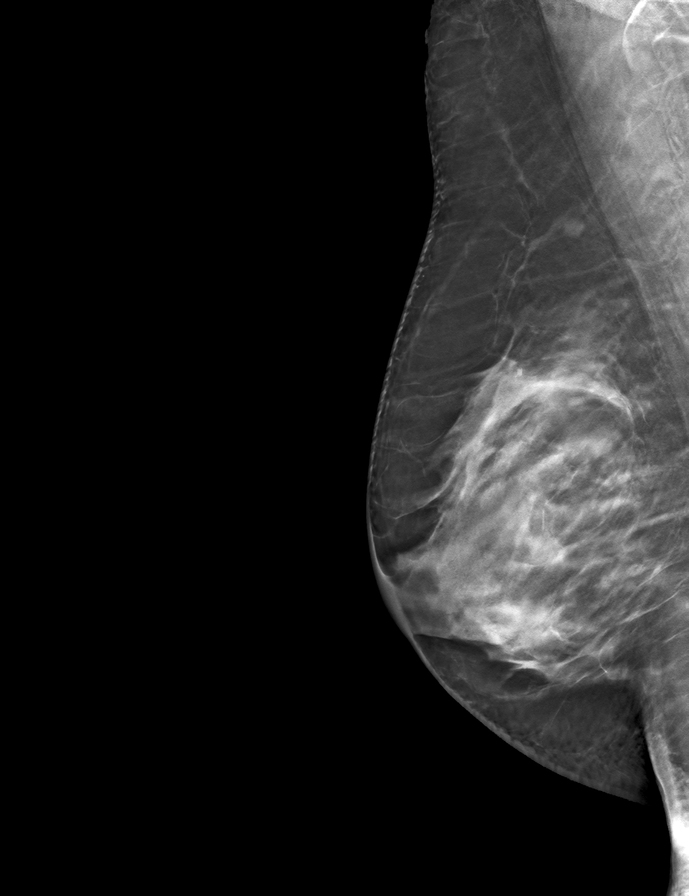
[frame 43/84]
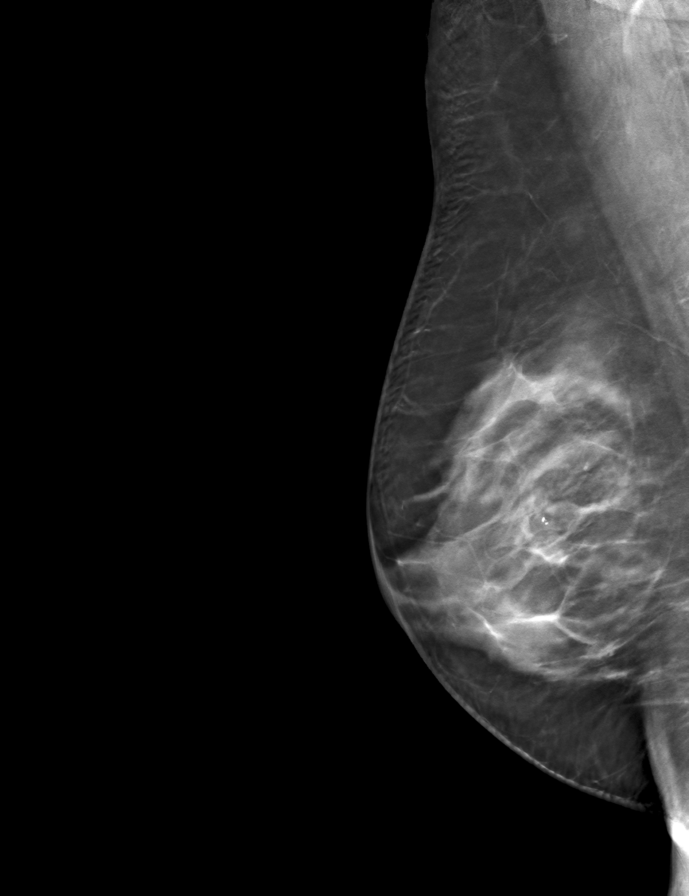

[R CC tomo · tomo slice 41/82.0]
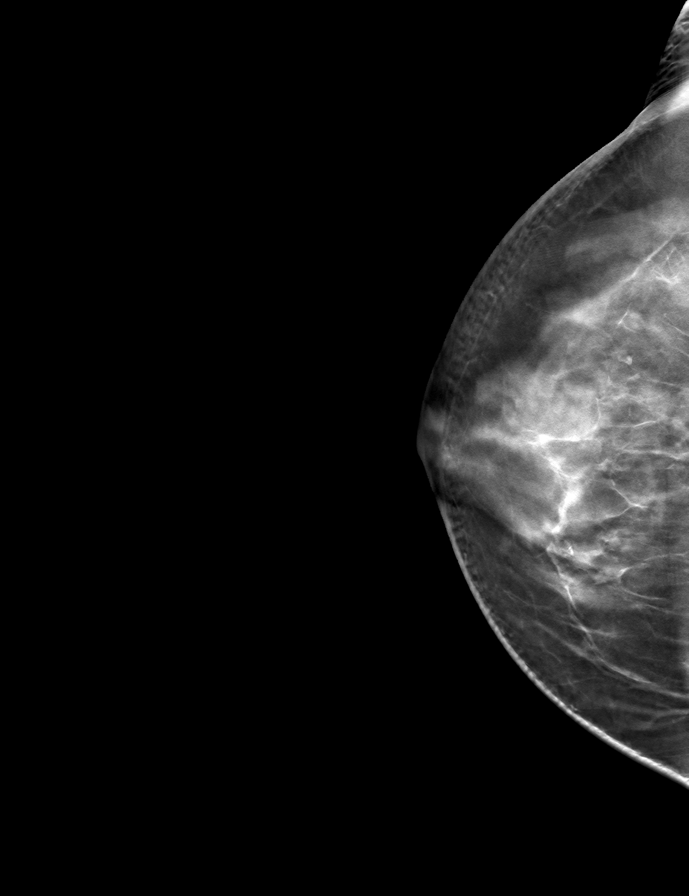

[L CC tomo · tomo slice 40/79.0]
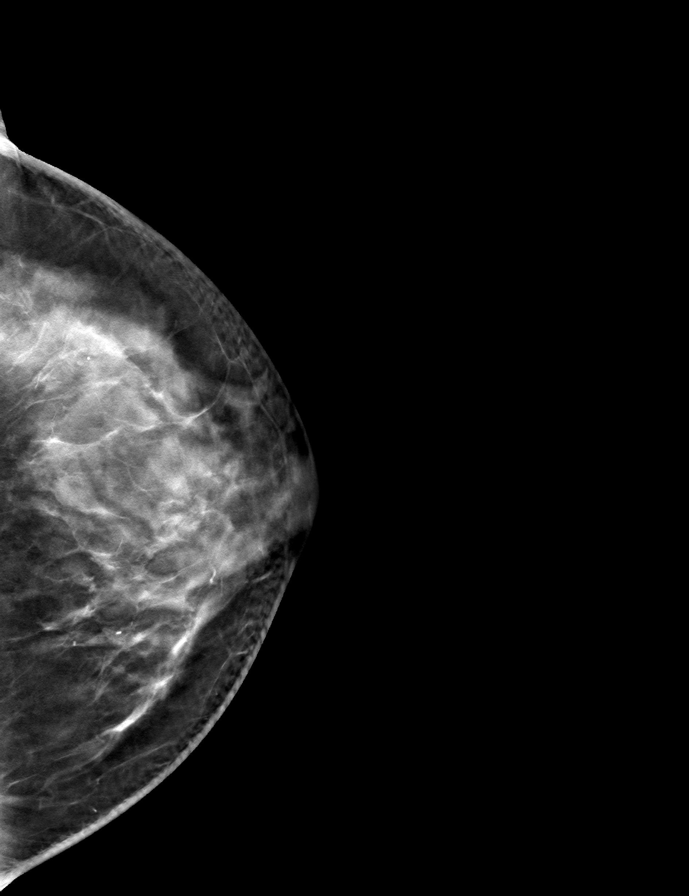

[L MLO tomo · tomo slice 42/83.0]
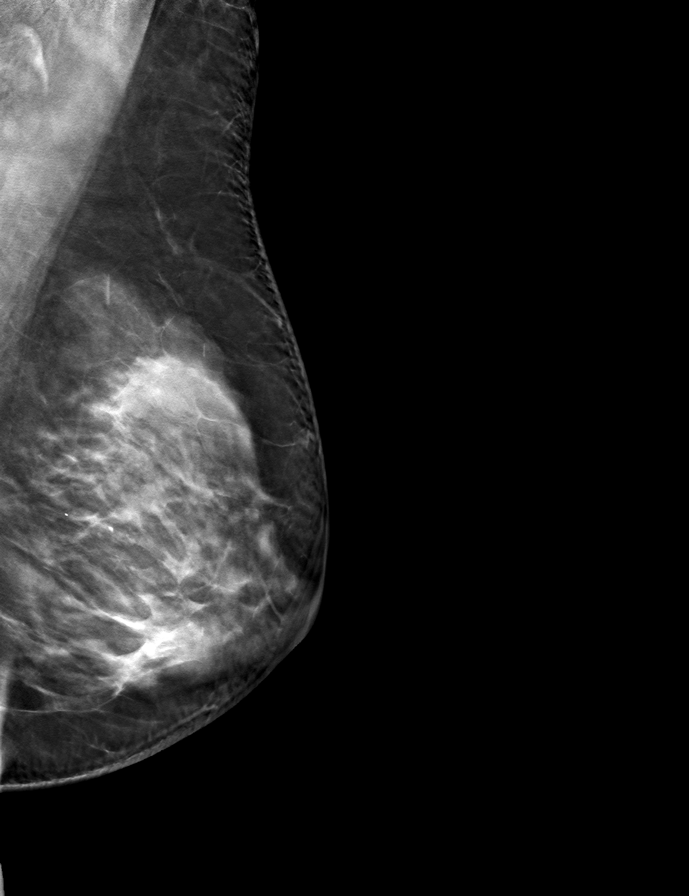

[9 of 24 positions shown; findings below may reference images not displayed]

ACR Breast Density Category c: The breast tissue is heterogeneously
dense, which may obscure small masses.
FINDINGS: There are no findings suspicious for malignancy.
IMPRESSION: No mammographic evidence of malignancy. A result letter of this
screening mammogram will be mailed directly to the patient.

RECOMMENDATION:
Screening mammogram in one year. (Code:[id number]-[id number])

BI-RADS CATEGORY  1: Negative.
